# Patient Record
Sex: Male | Born: 1965
Health system: Southern US, Community
[De-identification: ages and names within clinical notes are randomized; demographics above are authoritative.]

## PROBLEM LIST (undated history)

## (undated) DIAGNOSIS — E7800 Pure hypercholesterolemia, unspecified: Secondary | ICD-10-CM

## (undated) DIAGNOSIS — I10 Essential (primary) hypertension: Secondary | ICD-10-CM

## (undated) DIAGNOSIS — B2790 Infectious mononucleosis, unspecified without complication: Secondary | ICD-10-CM

## (undated) HISTORY — PX: SHOULDER ARTHROSCOPY: SHX128

## (undated) HISTORY — PX: HERNIA REPAIR: SHX51

---

## 1999-04-28 ENCOUNTER — Emergency Department (HOSPITAL_COMMUNITY): Admission: EM | Admit: 1999-04-28 | Discharge: 1999-04-28 | Payer: Self-pay | Admitting: Emergency Medicine

## 2000-06-12 ENCOUNTER — Encounter: Payer: Self-pay | Admitting: General Surgery

## 2000-06-12 ENCOUNTER — Encounter: Admission: RE | Admit: 2000-06-12 | Discharge: 2000-06-12 | Payer: Self-pay | Admitting: General Surgery

## 2000-06-13 ENCOUNTER — Ambulatory Visit (HOSPITAL_BASED_OUTPATIENT_CLINIC_OR_DEPARTMENT_OTHER): Admission: RE | Admit: 2000-06-13 | Discharge: 2000-06-13 | Payer: Self-pay | Admitting: General Surgery

## 2001-06-28 ENCOUNTER — Emergency Department (HOSPITAL_COMMUNITY): Admission: EM | Admit: 2001-06-28 | Discharge: 2001-06-29 | Payer: Self-pay | Admitting: Podiatry

## 2003-01-07 ENCOUNTER — Emergency Department (HOSPITAL_COMMUNITY): Admission: EM | Admit: 2003-01-07 | Discharge: 2003-01-07 | Payer: Self-pay

## 2004-06-25 ENCOUNTER — Emergency Department (HOSPITAL_COMMUNITY): Admission: EM | Admit: 2004-06-25 | Discharge: 2004-06-25 | Payer: Self-pay | Admitting: Emergency Medicine

## 2005-07-18 ENCOUNTER — Emergency Department (HOSPITAL_COMMUNITY): Admission: EM | Admit: 2005-07-18 | Discharge: 2005-07-18 | Payer: Self-pay | Admitting: Family Medicine

## 2006-04-26 ENCOUNTER — Emergency Department (HOSPITAL_COMMUNITY): Admission: EM | Admit: 2006-04-26 | Discharge: 2006-04-26 | Payer: Self-pay | Admitting: Emergency Medicine

## 2011-09-16 ENCOUNTER — Ambulatory Visit (INDEPENDENT_AMBULATORY_CARE_PROVIDER_SITE_OTHER): Payer: 59

## 2011-09-16 DIAGNOSIS — J9801 Acute bronchospasm: Secondary | ICD-10-CM

## 2011-09-16 DIAGNOSIS — J111 Influenza due to unidentified influenza virus with other respiratory manifestations: Secondary | ICD-10-CM

## 2011-09-16 DIAGNOSIS — R05 Cough: Secondary | ICD-10-CM

## 2014-11-21 ENCOUNTER — Emergency Department (HOSPITAL_COMMUNITY): Payer: 59

## 2014-11-21 ENCOUNTER — Emergency Department (HOSPITAL_COMMUNITY)
Admission: EM | Admit: 2014-11-21 | Discharge: 2014-11-21 | Disposition: A | Discharge status: Home / Self Care | Payer: 59 | Attending: Emergency Medicine | Admitting: Emergency Medicine

## 2014-11-21 ENCOUNTER — Encounter (HOSPITAL_COMMUNITY): Payer: Self-pay | Admitting: Cardiology

## 2014-11-21 DIAGNOSIS — Z8619 Personal history of other infectious and parasitic diseases: Secondary | ICD-10-CM | POA: Diagnosis not present

## 2014-11-21 DIAGNOSIS — R1011 Right upper quadrant pain: Secondary | ICD-10-CM | POA: Diagnosis not present

## 2014-11-21 DIAGNOSIS — Z79899 Other long term (current) drug therapy: Secondary | ICD-10-CM | POA: Diagnosis not present

## 2014-11-21 DIAGNOSIS — Z720 Tobacco use: Secondary | ICD-10-CM | POA: Diagnosis not present

## 2014-11-21 DIAGNOSIS — R1013 Epigastric pain: Secondary | ICD-10-CM | POA: Diagnosis not present

## 2014-11-21 DIAGNOSIS — R109 Unspecified abdominal pain: Secondary | ICD-10-CM | POA: Diagnosis present

## 2014-11-21 DIAGNOSIS — Z8639 Personal history of other endocrine, nutritional and metabolic disease: Secondary | ICD-10-CM | POA: Diagnosis not present

## 2014-11-21 HISTORY — DX: Infectious mononucleosis, unspecified without complication: B27.90

## 2014-11-21 HISTORY — DX: Pure hypercholesterolemia, unspecified: E78.00

## 2014-11-21 LAB — COMPREHENSIVE METABOLIC PANEL
ALT: 25 U/L (ref 0–53)
AST: 22 U/L (ref 0–37)
Albumin: 4 g/dL (ref 3.5–5.2)
Alkaline Phosphatase: 66 U/L (ref 39–117)
Anion gap: 10 (ref 5–15)
BUN: 16 mg/dL (ref 6–23)
CO2: 23 mmol/L (ref 19–32)
Calcium: 9.6 mg/dL (ref 8.4–10.5)
Chloride: 106 mmol/L (ref 96–112)
Creatinine, Ser: 1.1 mg/dL (ref 0.50–1.35)
GFR calc Af Amer: >90 mL/min (ref >90)
GFR calc non Af Amer: 79 mL/min — ABNORMAL LOW (ref >90)
Glucose, Bld: 93 mg/dL (ref 70–99)
Potassium: 4.5 mmol/L (ref 3.5–5.1)
Sodium: 139 mmol/L (ref 135–145)
Total Bilirubin: 0.7 mg/dL (ref 0.3–1.2)
Total Protein: 7 g/dL (ref 6.0–8.3)

## 2014-11-21 LAB — CBC WITH DIFFERENTIAL/PLATELET
BASOS ABS: 0 10*3/uL (ref 0.0–0.1)
BASOS PCT: 0 % (ref 0–1)
EOS ABS: 0.1 10*3/uL (ref 0.0–0.7)
Eosinophils Relative: 1 % (ref 0–5)
HCT: 42.7 % (ref 39.0–52.0)
Hemoglobin: 14.3 g/dL (ref 13.0–17.0)
LYMPHS PCT: 24 % (ref 12–46)
Lymphs Abs: 1.7 10*3/uL (ref 0.7–4.0)
MCH: 30.9 pg (ref 26.0–34.0)
MCHC: 33.5 g/dL (ref 30.0–36.0)
MCV: 92.2 fL (ref 78.0–100.0)
MONO ABS: 0.4 10*3/uL (ref 0.1–1.0)
Monocytes Relative: 5 % (ref 3–12)
Neutro Abs: 4.9 10*3/uL (ref 1.7–7.7)
Neutrophils Relative %: 70 % (ref 43–77)
Platelets: 224 10*3/uL (ref 150–400)
RBC: 4.63 MIL/uL (ref 4.22–5.81)
RDW: 12.3 % (ref 11.5–15.5)
WBC: 7.1 10*3/uL (ref 4.0–10.5)

## 2014-11-21 LAB — LIPASE, BLOOD: Lipase: 27 U/L (ref 11–59)

## 2014-11-21 MED ORDER — IOHEXOL 300 MG/ML  SOLN
100.0000 mL | Freq: Once | INTRAMUSCULAR | Status: AC | PRN
  Administered 2014-11-21: 100 mL via INTRAVENOUS

## 2014-11-21 MED ORDER — HYDROMORPHONE HCL 1 MG/ML IJ SOLN
1.0000 mg | Freq: Once | INTRAMUSCULAR | Status: AC
  Administered 2014-11-21: 1 mg via INTRAVENOUS
  Filled 2014-11-21: qty 1

## 2014-11-21 MED ORDER — IOHEXOL 300 MG/ML  SOLN
25.0000 mL | Freq: Once | INTRAMUSCULAR | Status: AC | PRN
  Administered 2014-11-21: 25 mL via ORAL

## 2014-11-21 NOTE — Discharge Instructions (Signed)
Read the information below.  You may return to the Emergency Department at any time for worsening condition or any new symptoms that concern you.  If you develop high fevers, worsening abdominal pain, uncontrolled vomiting, or are unable to tolerate fluids by mouth, return to the ER for a recheck.     Abdominal Pain Many things can cause abdominal pain. Usually, abdominal pain is not caused by a disease and will improve without treatment. It can often be observed and treated at home. Your health care provider will do a physical exam and possibly order blood tests and X-rays to help determine the seriousness of your pain. However, in many cases, more time must pass before a clear cause of the pain can be found. Before that point, your health care provider may not know if you need more testing or further treatment. HOME CARE INSTRUCTIONS  Monitor your abdominal pain for any changes. The following actions may help to alleviate any discomfort you are experiencing:  Only take over-the-counter or prescription medicines as directed by your health care provider.  Do not take laxatives unless directed to do so by your health care provider.  Try a clear liquid diet (broth, tea, or water) as directed by your health care provider. Slowly move to a bland diet as tolerated. SEEK MEDICAL CARE IF:  You have unexplained abdominal pain.  You have abdominal pain associated with nausea or diarrhea.  You have pain when you urinate or have a bowel movement.  You experience abdominal pain that wakes you in the night.  You have abdominal pain that is worsened or improved by eating food.  You have abdominal pain that is worsened with eating fatty foods.  You have a fever. SEEK IMMEDIATE MEDICAL CARE IF:   Your pain does not go away within 2 hours.  You keep throwing up (vomiting).  Your pain is felt only in portions of the abdomen, such as the right side or the left lower portion of the abdomen.  You pass  bloody or black tarry stools. MAKE SURE YOU:  Understand these instructions.   Will watch your condition.   Will get help right away if you are not doing well or get worse.  Document Released: 07/04/2005 Document Revised: 09/29/2013 Document Reviewed: 06/03/2013 [facility] Patient Information 2015 [place], [place]. This information is not intended to replace advice given to you by your health care provider. Make sure you discuss any questions you have with your health care provider.  Pain of Unknown Etiology (Pain Without a Known Cause) You have come to your caregiver because of pain. Pain can occur in any part of the body. Often there is not a definite cause. If your laboratory (blood or urine) work was normal and X-rays or other studies were normal, your caregiver may treat you without knowing the cause of the pain. An example of this is the headache. Most headaches are diagnosed by taking a history. This means your caregiver asks you questions about your headaches. Your caregiver determines a treatment based on your answers. Usually testing done for headaches is normal. Often testing is not done unless there is no response to medications. Regardless of where your pain is located today, you can be given medications to make you comfortable. If no physical cause of pain can be found, most cases of pain will gradually leave as suddenly as they came.  If you have a painful condition and no reason can be found for the pain, it is important that you  follow up with your caregiver. If the pain becomes worse or does not go away, it may be necessary to repeat tests and look further for a possible cause.  Only take over-the-counter or prescription medicines for pain, discomfort, or fever as directed by your caregiver.  For the protection of your privacy, test results cannot be given over the phone. Make sure you receive the results of your test. Ask how these results are to be obtained if you have not been  informed. It is your responsibility to obtain your test results.  You may continue all activities unless the activities cause more pain. When the pain lessens, it is important to gradually resume normal activities. Resume activities by beginning slowly and gradually increasing the intensity and duration of the activities or exercise. During periods of severe pain, bed rest may be helpful. Lie or sit in any position that is comfortable.  Ice used for acute (sudden) conditions may be effective. Use a large plastic bag filled with ice and wrapped in a towel. This may provide pain relief.  See your caregiver for continued problems. Your caregiver can help or refer you for exercises or physical therapy if necessary. If you were given medications for your condition, do not drive, operate machinery or power tools, or sign legal documents for 24 hours. Do not drink alcohol, take sleeping pills, or take other medications that may interfere with treatment. See your caregiver immediately if you have pain that is becoming worse and not relieved by medications. Document Released: 06/19/2001 Document Revised: 07/15/2013 Document Reviewed: 09/24/2005 [facility] Patient Information 2015 [place], [place]. This information is not intended to replace advice given to you by your health care provider. Make sure you discuss any questions you have with your health care provider.

## 2014-11-21 NOTE — ED Notes (Signed)
Pt reports he developed epigastric pain about 0915. Denies any n/v with the pain. Pain comes and goes, rates a 8/10.

## 2014-11-21 NOTE — ED Provider Notes (Signed)
CSN: [id number]     Arrival date & time 11/21/14  1008 History   First MD Initiated Contact with Patient 11/21/14 1027     Chief Complaint  Patient presents with  . Abdominal Pain     (Consider location/radiation/quality/duration/timing/severity/associated sxs/prior Treatment) The history is provided by the patient.     Patient presents with sudden onset epigastric pain.  Pain is described as a "knot" and "visceral," came on suddenly, is 8/10 intensity, somewhat better with sitting upright, still, and controlling his breathing.   No radiation. Has had normal bowel movements without hematochezia or melena.  Denies fevers, nausea, urinary symptoms, bowel changes.  No abnormal foods.  No significant NSAID or alcohol usage.  Pt does smoke cigarettes.  No known PUD, pancreatitis.  Does have occasional indigestion/reflux symptoms.  Past Medical History  Diagnosis Date  . Mononucleosis   . Hypercholesteremia    Past Surgical History  Procedure Laterality Date  . Shoulder arthroscopy    . Hernia repair     History reviewed. No pertinent family history. History  Substance Use Topics  . Smoking status: Current Some Day Smoker  . Smokeless tobacco: Not on file  . Alcohol Use: Yes    Review of Systems  All other systems reviewed and are negative.     Allergies  Review of patient's allergies indicates no known allergies.  Home Medications   Prior to Admission medications   Medication Sig Start Date End Date Taking? Authorizing Provider  ibuprofen (ADVIL,MOTRIN) 800 MG tablet Take 800 mg by mouth every 8 (eight) hours as needed for moderate pain.   Yes Historical Provider, MD  varenicline (CHANTIX) 1 MG tablet Take 1 mg by mouth 2 (two) times daily.   Yes Historical Provider, MD   BP 147/97 mmHg  Temp(Src) 98.3 F (36.8 C) (Oral)  Resp 18  SpO2 99% Physical Exam  Constitutional: He appears well-developed and well-nourished. No distress.  HENT:  Head: Normocephalic and  atraumatic.  Neck: Neck supple.  Cardiovascular: Normal rate and regular rhythm.   Pulmonary/Chest: Effort normal and breath sounds normal. No respiratory distress. He has no wheezes. He has no rales.  Abdominal: Soft. He exhibits no distension and no mass. There is tenderness in the right upper quadrant and epigastric area. There is guarding. There is no rebound.  Neurological: He is alert. He exhibits normal muscle tone.  Skin: He is not diaphoretic.  Nursing note and vitals reviewed.   ED Course  Procedures (including critical care time) Labs Review Labs Reviewed  COMPREHENSIVE METABOLIC PANEL - Abnormal; Notable for the following:    GFR calc non Af Amer 79 (*)    All other components within normal limits  LIPASE, BLOOD  CBC WITH DIFFERENTIAL/PLATELET    Imaging Review Ct Abdomen Pelvis W Contrast  11/21/2014   CLINICAL DATA:  Acute onset epigastric abdominal pain several hrs ago.  EXAM: CT ABDOMEN AND PELVIS WITH CONTRAST  TECHNIQUE: Multidetector CT imaging of the abdomen and pelvis was performed using the standard protocol following bolus administration of intravenous contrast.  CONTRAST:  111mL OMNIPAQUE IOHEXOL 300 MG/ML  SOLN  COMPARISON:  None.  FINDINGS: Lower Chest:  Unremarkable.  Hepatobiliary: No masses or other significant abnormality identified. Gallbladder is unremarkable.  Pancreas: No mass, inflammatory changes, or other significant abnormality identified.  Spleen:  Within normal limits in size and appearance.  Adrenals:  No masses identified.  Kidneys/Urinary Tract:  No evidence of masses or hydronephrosis.  Stomach/Bowel/Peritoneum: No evidence of wall thickening,  mass, or obstruction. Normal appendix visualized.  Vascular/Lymphatic: No pathologically enlarged lymph nodes identified. No other significant abnormality visualized.  Reproductive:  No mass or other significant abnormality identified.  Other:  None.  Musculoskeletal:  No suspicious bone lesions identified.   IMPRESSION: Negative. No acute findings or other significant abnormality identified within the abdomen or pelvis.   Electronically Signed   By: Earle Gell M.D.   On: 11/21/2014 13:24   Dg Abd Acute W/chest  11/21/2014   CLINICAL DATA:  Abdominal pain and nausea.  Initial encounter.  EXAM: ACUTE ABDOMEN SERIES (ABDOMEN 2 VIEW & CHEST 1 VIEW)  COMPARISON:  04/26/2006 radiographs  FINDINGS: The cardiomediastinal silhouette is unremarkable.  Lungs are clear.  There is no evidence of airspace disease, pleural effusion or pneumothorax.  Scattered nondistended gas-filled loops of small bowel are identified.  Gas and stool in the colon and rectum are noted.  No dilated bowel loops or pneumoperitoneum identified.  No suspicious calcifications identified.  No acute bony abnormalities are noted.  IMPRESSION: Nonspecific nonobstructive bowel gas pattern - may represent an enteritis.  No evidence of active cardiopulmonary disease.   Electronically Signed   By: Margarette Canada M.D.   On: 11/21/2014 12:42     EKG Interpretation None       Pt has remained pain free in ED since returning from xray.    MDM   Final diagnoses:  Epigastric pain   Afebrile, nontoxic patient with sudden onset epigastric pain without associated symptoms.  Pain resolved in ED.  Labs normal, acute abd xrays unremarkable, CT abd/pelvis without acute findings.   D/C home with PCP follow up.  Discussed result, findings, treatment, and follow up  with patient.  Pt given return precautions.  Pt verbalizes understanding and agrees with plan.        Clayton Bibles, PA-C 11/21/14 [place], MD 11/21/14 [phone]

## 2015-04-23 ENCOUNTER — Encounter (HOSPITAL_COMMUNITY): Payer: Self-pay | Admitting: Emergency Medicine

## 2015-04-23 ENCOUNTER — Emergency Department (HOSPITAL_COMMUNITY): Payer: 59

## 2015-04-23 ENCOUNTER — Emergency Department (HOSPITAL_COMMUNITY)
Admission: EM | Admit: 2015-04-23 | Discharge: 2015-04-23 | Disposition: A | Discharge status: Home / Self Care | Payer: 59 | Attending: Emergency Medicine | Admitting: Emergency Medicine

## 2015-04-23 DIAGNOSIS — R51 Headache: Secondary | ICD-10-CM | POA: Insufficient documentation

## 2015-04-23 DIAGNOSIS — Z79899 Other long term (current) drug therapy: Secondary | ICD-10-CM | POA: Diagnosis not present

## 2015-04-23 DIAGNOSIS — Z8639 Personal history of other endocrine, nutritional and metabolic disease: Secondary | ICD-10-CM | POA: Diagnosis not present

## 2015-04-23 DIAGNOSIS — Z720 Tobacco use: Secondary | ICD-10-CM | POA: Diagnosis not present

## 2015-04-23 DIAGNOSIS — Z8619 Personal history of other infectious and parasitic diseases: Secondary | ICD-10-CM | POA: Insufficient documentation

## 2015-04-23 DIAGNOSIS — R519 Headache, unspecified: Secondary | ICD-10-CM

## 2015-04-23 MED ORDER — SODIUM CHLORIDE 0.9 % IV BOLUS (SEPSIS)
1000.0000 mL | Freq: Once | INTRAVENOUS | Status: AC
  Administered 2015-04-23: 1000 mL via INTRAVENOUS

## 2015-04-23 MED ORDER — DIPHENHYDRAMINE HCL 50 MG/ML IJ SOLN
50.0000 mg | Freq: Once | INTRAMUSCULAR | Status: AC
  Administered 2015-04-23: 50 mg via INTRAVENOUS
  Filled 2015-04-23: qty 1

## 2015-04-23 MED ORDER — METOCLOPRAMIDE HCL 5 MG/ML IJ SOLN
10.0000 mg | Freq: Once | INTRAMUSCULAR | Status: AC
  Administered 2015-04-23: 10 mg via INTRAVENOUS
  Filled 2015-04-23: qty 2

## 2015-04-23 NOTE — Discharge Instructions (Signed)
General Headache Without Cause A headache is pain or discomfort felt around the head or neck area. The specific cause of a headache may not be found. There are many causes and types of headaches. A few common ones are:  Tension headaches.  Migraine headaches.  Cluster headaches.  Chronic daily headaches. HOME CARE INSTRUCTIONS   Keep all follow-up appointments with your caregiver or any specialist referral.  Only take over-the-counter or prescription medicines for pain or discomfort as directed by your caregiver.  Lie down in a dark, quiet room when you have a headache.  Keep a headache journal to find out what may trigger your migraine headaches. For example, write down:  What you eat and drink.  How much sleep you get.  Any change to your diet or medicines.  Try massage or other relaxation techniques.  Put ice packs or heat on the head and neck. Use these 3 to 4 times per day for 15 to 20 minutes each time, or as needed.  Limit stress.  Sit up straight, and do not tense your muscles.  Quit smoking if you smoke.  Limit alcohol use.  Decrease the amount of caffeine you drink, or stop drinking caffeine.  Eat and sleep on a regular schedule.  Get 7 to 9 hours of sleep, or as recommended by your caregiver.  Keep lights dim if bright lights bother you and make your headaches worse. SEEK MEDICAL CARE IF:   You have problems with the medicines you were prescribed.  Your medicines are not working.  You have a change from the usual headache.  You have nausea or vomiting. SEEK IMMEDIATE MEDICAL CARE IF:   Your headache becomes severe.  You have a fever.  You have a stiff neck.  You have loss of vision.  You have muscular weakness or loss of muscle control.  You start losing your balance or have trouble walking.  You feel faint or pass out.  You have severe symptoms that are different from your first symptoms. MAKE SURE YOU:   Understand these  instructions.  Will watch your condition.  Will get help right away if you are not doing well or get worse. Document Released: 09/24/2005 Document Revised: 12/17/2011 Document Reviewed: 10/10/2011 [facility] Patient Information 2015 [place], [place]. This information is not intended to replace advice given to you by your health care provider. Make sure you discuss any questions you have with your health care provider.    Possible Migraine Headache A migraine headache is an intense, throbbing pain on one or both sides of your head. A migraine can last for 30 minutes to several hours. CAUSES  The exact cause of a migraine headache is not always known. However, a migraine may be caused when nerves in the brain become irritated and release chemicals that cause inflammation. This causes pain. Certain things may also trigger migraines, such as:  Alcohol.  Smoking.  Stress.  Menstruation.  Aged cheeses.  Foods or drinks that contain nitrates, glutamate, aspartame, or tyramine.  Lack of sleep.  Chocolate.  Caffeine.  Hunger.  Physical exertion.  Fatigue.  Medicines used to treat chest pain (nitroglycerine), birth control pills, estrogen, and some blood pressure medicines. SIGNS AND SYMPTOMS  Pain on one or both sides of your head.  Pulsating or throbbing pain.  Severe pain that prevents daily activities.  Pain that is aggravated by any physical activity.  Nausea, vomiting, or both.  Dizziness.  Pain with exposure to bright lights, loud noises, or activity.  General sensitivity to bright lights, loud noises, or smells. Before you get a migraine, you may get warning signs that a migraine is coming (aura). An aura may include:  Seeing flashing lights.  Seeing bright spots, halos, or zigzag lines.  Having tunnel vision or blurred vision.  Having feelings of numbness or tingling.  Having trouble talking.  Having muscle weakness. DIAGNOSIS  A migraine headache  is often diagnosed based on:  Symptoms.  Physical exam.  A CT scan or MRI of your head. These imaging tests cannot diagnose migraines, but they can help rule out other causes of headaches. TREATMENT Medicines may be given for pain and nausea. Medicines can also be given to help prevent recurrent migraines.  HOME CARE INSTRUCTIONS  Only take over-the-counter or prescription medicines for pain or discomfort as directed by your health care provider. The use of long-term narcotics is not recommended.  Lie down in a dark, quiet room when you have a migraine.  Keep a journal to find out what may trigger your migraine headaches. For example, write down:  What you eat and drink.  How much sleep you get.  Any change to your diet or medicines.  Limit alcohol consumption.  Quit smoking if you smoke.  Get 7-9 hours of sleep, or as recommended by your health care provider.  Limit stress.  Keep lights dim if bright lights bother you and make your migraines worse. SEEK IMMEDIATE MEDICAL CARE IF:   Your migraine becomes severe.  You have a fever.  You have a stiff neck.  You have vision loss.  You have muscular weakness or loss of muscle control.  You start losing your balance or have trouble walking.  You feel faint or pass out.  You have severe symptoms that are different from your first symptoms. MAKE SURE YOU:   Understand these instructions.  Will watch your condition.  Will get help right away if you are not doing well or get worse. Document Released: 09/24/2005 Document Revised: 02/08/2014 Document Reviewed: 06/01/2013 [facility] Patient Information 2015 [place], [place]. This information is not intended to replace advice given to you by your health care provider. Make sure you discuss any questions you have with your health care provider.

## 2015-04-23 NOTE — ED Notes (Signed)
Patient at CT at this time. 

## 2015-04-23 NOTE — ED Notes (Signed)
Patient returned from [place]. Denies headache at this time. No distress noted. Spouse at bedside.

## 2015-04-23 NOTE — ED Notes (Signed)
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 Pain increases with movement, light, stress. Patient has taken 1500 mg Tylenol & 500 Aspirin/Migraine within the past hour. Pain 8/10 at worst. Now headache is 4/10 on 0-10 pain scale. Denies nausea at this time.

## 2015-04-23 NOTE — ED Provider Notes (Signed)
TIME SEEN: 5:35 AM  CHIEF COMPLAINT: Headache  HPI: Pt is a 49 y.o. male with history of hyperlipidemia who presents to the emergency department with headache that started at 4 PM yesterday. Reports that the headache started gradually behind both of his eyes at work and then radiated into his posterior head. Describes as dull, constant. Worse with lights and movement. Patient took Tylenol and Excedrin prior to arrival with some relief. No thunderclap or severe sudden onset headache. No numbness, tingling or focal weakness. Does report that 3 days ago he fell into a body of water backwards and struck his head hard. No loss of consciousness. Not on anticoagulation or any antiplatelets agent. Denies a history of chronic headaches. Denies fever but has had some lower cervical spine pain.  ROS: See HPI Constitutional: no fever  Eyes: no drainage  ENT: no runny nose   Cardiovascular:  no chest pain  Resp: no SOB  GI: no vomiting GU: no dysuria Integumentary: no rash  Allergy: no hives  Musculoskeletal: no leg swelling  Neurological: no slurred speech ROS otherwise negative  PAST MEDICAL HISTORY/PAST SURGICAL HISTORY:  Past Medical History  Diagnosis Date  . Mononucleosis   . Hypercholesteremia     MEDICATIONS:  Prior to Admission medications   Medication Sig Start Date End Date Taking? Authorizing Provider  ibuprofen (ADVIL,MOTRIN) 800 MG tablet Take 800 mg by mouth every 8 (eight) hours as needed for moderate pain.    Historical Provider, MD  varenicline (CHANTIX) 1 MG tablet Take 1 mg by mouth 2 (two) times daily.    Historical Provider, MD    ALLERGIES:  No Known Allergies  SOCIAL HISTORY:  History  Substance Use Topics  . Smoking status: Current Some Day Smoker -- 0.50 packs/day    Types: Cigarettes  . Smokeless tobacco: Current User    Types: Snuff  . Alcohol Use: 1.8 oz/week    3 Cans of beer per week    FAMILY HISTORY: History reviewed. No pertinent family  history.  EXAM: BP 137/96 mmHg  Pulse 65  Temp(Src) 97.5 F (36.4 C) (Oral)  Resp 16  SpO2 98% CONSTITUTIONAL: Alert and oriented and responds appropriately to questions. Well-appearing; well-nourished; GCS 15 HEAD: Normocephalic; atraumatic EYES: Conjunctivae clear, PERRL, EOMI ENT: normal nose; no rhinorrhea; moist mucous membranes; pharynx without lesions noted; no dental injury; no septal hematoma NECK: Supple, no meningismus, no LAD; patient has some lower cervical spine tenderness without step-off or deformity but has full range of motion in the neck that is painless CARD: RRR; S1 and S2 appreciated; no murmurs, no clicks, no rubs, no gallops RESP: Normal chest excursion without splinting or tachypnea; breath sounds clear and equal bilaterally; no wheezes, no rhonchi, no rales; no hypoxia or respiratory distress CHEST:  chest wall stable, no crepitus or ecchymosis or deformity, nontender to palpation ABD/GI: Normal bowel sounds; non-distended; soft, non-tender, no rebound, no guarding PELVIS:  stable, nontender to palpation BACK:  The back appears normal and is non-tender to palpation, there is no CVA tenderness; no midline spinal tenderness, step-off or deformity EXT: Normal ROM in all joints; non-tender to palpation; no edema; normal capillary refill; no cyanosis, no bony tenderness or bony deformity of patient's extremities, no joint effusion, no ecchymosis or lacerations    SKIN: Normal color for age and race; warm NEURO: Moves all extremities equally, sensation to light touch intact diffusely, cranial nerves II through XII intact, no dysmetria to finger to nose testing bilaterally, strength 5/5 in all  4 extremities PSYCH: The patient's mood and manner are appropriate. Grooming and personal hygiene are appropriate.  MEDICAL DECISION MAKING: Patient here with headache, neck pain. Suspect migraine as the cause of his headache, cervical strain from recent fall. No sudden onset, severe  headache. It was gradual in nature and has improved with over-the-counter medications. He is neurologically intact, afebrile, nontoxic. Head and cervical spine CT unremarkable. Pain completely resolved after Reglan and Benadryl. Will discharge home with outpatient neurology follow-up information. Discussed return precautions. He verbalized understanding and is comfortable with this plan.      [place], DO 04/23/15 [phone]

## 2017-01-18 DIAGNOSIS — C44311 Basal cell carcinoma of skin of nose: Secondary | ICD-10-CM | POA: Diagnosis not present

## 2017-01-18 DIAGNOSIS — D2371 Other benign neoplasm of skin of right lower limb, including hip: Secondary | ICD-10-CM | POA: Diagnosis not present

## 2017-01-18 DIAGNOSIS — L821 Other seborrheic keratosis: Secondary | ICD-10-CM | POA: Diagnosis not present

## 2017-02-06 DIAGNOSIS — C44311 Basal cell carcinoma of skin of nose: Secondary | ICD-10-CM | POA: Diagnosis not present

## 2017-03-11 DIAGNOSIS — D485 Neoplasm of uncertain behavior of skin: Secondary | ICD-10-CM | POA: Diagnosis not present

## 2017-03-11 DIAGNOSIS — D2371 Other benign neoplasm of skin of right lower limb, including hip: Secondary | ICD-10-CM | POA: Diagnosis not present

## 2017-09-13 ENCOUNTER — Other Ambulatory Visit: Payer: Self-pay | Admitting: Internal Medicine

## 2017-09-13 ENCOUNTER — Ambulatory Visit
Admission: RE | Admit: 2017-09-13 | Discharge: 2017-09-13 | Disposition: A | Discharge status: Home / Self Care | Payer: 59 | Source: Ambulatory Visit | Attending: Internal Medicine | Admitting: Internal Medicine

## 2017-09-13 DIAGNOSIS — R05 Cough: Secondary | ICD-10-CM | POA: Diagnosis not present

## 2017-09-13 DIAGNOSIS — Z0000 Encounter for general adult medical examination without abnormal findings: Secondary | ICD-10-CM | POA: Diagnosis not present

## 2017-09-13 DIAGNOSIS — E7800 Pure hypercholesterolemia, unspecified: Secondary | ICD-10-CM | POA: Diagnosis not present

## 2017-09-13 DIAGNOSIS — R059 Cough, unspecified: Secondary | ICD-10-CM

## 2017-09-13 DIAGNOSIS — R030 Elevated blood-pressure reading, without diagnosis of hypertension: Secondary | ICD-10-CM | POA: Diagnosis not present

## 2017-10-02 DIAGNOSIS — G4733 Obstructive sleep apnea (adult) (pediatric): Secondary | ICD-10-CM | POA: Diagnosis not present

## 2017-10-06 DIAGNOSIS — S335XXA Sprain of ligaments of lumbar spine, initial encounter: Secondary | ICD-10-CM | POA: Diagnosis not present

## 2017-10-11 DIAGNOSIS — M5431 Sciatica, right side: Secondary | ICD-10-CM | POA: Diagnosis not present

## 2017-10-11 DIAGNOSIS — G4733 Obstructive sleep apnea (adult) (pediatric): Secondary | ICD-10-CM | POA: Diagnosis not present

## 2017-10-17 DIAGNOSIS — M5441 Lumbago with sciatica, right side: Secondary | ICD-10-CM | POA: Diagnosis not present

## 2017-10-17 DIAGNOSIS — M5136 Other intervertebral disc degeneration, lumbar region: Secondary | ICD-10-CM | POA: Diagnosis not present

## 2017-10-17 DIAGNOSIS — M62830 Muscle spasm of back: Secondary | ICD-10-CM | POA: Diagnosis not present

## 2017-10-21 DIAGNOSIS — M5136 Other intervertebral disc degeneration, lumbar region: Secondary | ICD-10-CM | POA: Diagnosis not present

## 2017-10-21 DIAGNOSIS — M62830 Muscle spasm of back: Secondary | ICD-10-CM | POA: Diagnosis not present

## 2017-10-21 DIAGNOSIS — M5441 Lumbago with sciatica, right side: Secondary | ICD-10-CM | POA: Diagnosis not present

## 2017-10-25 DIAGNOSIS — M5441 Lumbago with sciatica, right side: Secondary | ICD-10-CM | POA: Diagnosis not present

## 2017-10-25 DIAGNOSIS — M5136 Other intervertebral disc degeneration, lumbar region: Secondary | ICD-10-CM | POA: Diagnosis not present

## 2017-10-25 DIAGNOSIS — M62830 Muscle spasm of back: Secondary | ICD-10-CM | POA: Diagnosis not present

## 2017-10-30 DIAGNOSIS — M5136 Other intervertebral disc degeneration, lumbar region: Secondary | ICD-10-CM | POA: Diagnosis not present

## 2017-10-30 DIAGNOSIS — M5441 Lumbago with sciatica, right side: Secondary | ICD-10-CM | POA: Diagnosis not present

## 2017-10-30 DIAGNOSIS — M62830 Muscle spasm of back: Secondary | ICD-10-CM | POA: Diagnosis not present

## 2017-10-31 DIAGNOSIS — M5441 Lumbago with sciatica, right side: Secondary | ICD-10-CM | POA: Diagnosis not present

## 2017-10-31 DIAGNOSIS — M5136 Other intervertebral disc degeneration, lumbar region: Secondary | ICD-10-CM | POA: Diagnosis not present

## 2017-10-31 DIAGNOSIS — M62830 Muscle spasm of back: Secondary | ICD-10-CM | POA: Diagnosis not present

## 2017-11-04 DIAGNOSIS — M62830 Muscle spasm of back: Secondary | ICD-10-CM | POA: Diagnosis not present

## 2017-11-04 DIAGNOSIS — M5441 Lumbago with sciatica, right side: Secondary | ICD-10-CM | POA: Diagnosis not present

## 2017-11-04 DIAGNOSIS — M5136 Other intervertebral disc degeneration, lumbar region: Secondary | ICD-10-CM | POA: Diagnosis not present

## 2017-11-05 DIAGNOSIS — M545 Low back pain: Secondary | ICD-10-CM | POA: Diagnosis not present

## 2017-11-05 DIAGNOSIS — M5441 Lumbago with sciatica, right side: Secondary | ICD-10-CM | POA: Diagnosis not present

## 2017-11-05 DIAGNOSIS — M5136 Other intervertebral disc degeneration, lumbar region: Secondary | ICD-10-CM | POA: Diagnosis not present

## 2017-11-05 DIAGNOSIS — M62830 Muscle spasm of back: Secondary | ICD-10-CM | POA: Diagnosis not present

## 2017-11-08 DIAGNOSIS — M5431 Sciatica, right side: Secondary | ICD-10-CM | POA: Diagnosis not present

## 2017-11-08 DIAGNOSIS — M545 Low back pain: Secondary | ICD-10-CM | POA: Diagnosis not present

## 2017-11-11 DIAGNOSIS — G4733 Obstructive sleep apnea (adult) (pediatric): Secondary | ICD-10-CM | POA: Diagnosis not present

## 2017-11-13 DIAGNOSIS — M62830 Muscle spasm of back: Secondary | ICD-10-CM | POA: Diagnosis not present

## 2017-11-13 DIAGNOSIS — J111 Influenza due to unidentified influenza virus with other respiratory manifestations: Secondary | ICD-10-CM | POA: Diagnosis not present

## 2017-11-13 DIAGNOSIS — M5136 Other intervertebral disc degeneration, lumbar region: Secondary | ICD-10-CM | POA: Diagnosis not present

## 2017-11-13 DIAGNOSIS — M5441 Lumbago with sciatica, right side: Secondary | ICD-10-CM | POA: Diagnosis not present

## 2017-11-18 DIAGNOSIS — M5136 Other intervertebral disc degeneration, lumbar region: Secondary | ICD-10-CM | POA: Diagnosis not present

## 2017-11-18 DIAGNOSIS — M62830 Muscle spasm of back: Secondary | ICD-10-CM | POA: Diagnosis not present

## 2017-11-18 DIAGNOSIS — M5441 Lumbago with sciatica, right side: Secondary | ICD-10-CM | POA: Diagnosis not present

## 2017-11-19 DIAGNOSIS — M62830 Muscle spasm of back: Secondary | ICD-10-CM | POA: Diagnosis not present

## 2017-11-19 DIAGNOSIS — M5441 Lumbago with sciatica, right side: Secondary | ICD-10-CM | POA: Diagnosis not present

## 2017-11-19 DIAGNOSIS — M5136 Other intervertebral disc degeneration, lumbar region: Secondary | ICD-10-CM | POA: Diagnosis not present

## 2017-11-22 DIAGNOSIS — M62830 Muscle spasm of back: Secondary | ICD-10-CM | POA: Diagnosis not present

## 2017-11-22 DIAGNOSIS — M5136 Other intervertebral disc degeneration, lumbar region: Secondary | ICD-10-CM | POA: Diagnosis not present

## 2017-11-22 DIAGNOSIS — M5441 Lumbago with sciatica, right side: Secondary | ICD-10-CM | POA: Diagnosis not present

## 2017-11-27 DIAGNOSIS — M5441 Lumbago with sciatica, right side: Secondary | ICD-10-CM | POA: Diagnosis not present

## 2017-11-27 DIAGNOSIS — M62830 Muscle spasm of back: Secondary | ICD-10-CM | POA: Diagnosis not present

## 2017-11-27 DIAGNOSIS — M5136 Other intervertebral disc degeneration, lumbar region: Secondary | ICD-10-CM | POA: Diagnosis not present

## 2017-12-09 DIAGNOSIS — G4733 Obstructive sleep apnea (adult) (pediatric): Secondary | ICD-10-CM | POA: Diagnosis not present

## 2017-12-11 DIAGNOSIS — M5441 Lumbago with sciatica, right side: Secondary | ICD-10-CM | POA: Diagnosis not present

## 2017-12-11 DIAGNOSIS — M5136 Other intervertebral disc degeneration, lumbar region: Secondary | ICD-10-CM | POA: Diagnosis not present

## 2017-12-11 DIAGNOSIS — M62830 Muscle spasm of back: Secondary | ICD-10-CM | POA: Diagnosis not present

## 2017-12-12 DIAGNOSIS — M5136 Other intervertebral disc degeneration, lumbar region: Secondary | ICD-10-CM | POA: Diagnosis not present

## 2017-12-12 DIAGNOSIS — M5441 Lumbago with sciatica, right side: Secondary | ICD-10-CM | POA: Diagnosis not present

## 2017-12-12 DIAGNOSIS — M62830 Muscle spasm of back: Secondary | ICD-10-CM | POA: Diagnosis not present

## 2017-12-17 DIAGNOSIS — M5136 Other intervertebral disc degeneration, lumbar region: Secondary | ICD-10-CM | POA: Diagnosis not present

## 2017-12-17 DIAGNOSIS — M62830 Muscle spasm of back: Secondary | ICD-10-CM | POA: Diagnosis not present

## 2017-12-17 DIAGNOSIS — M5441 Lumbago with sciatica, right side: Secondary | ICD-10-CM | POA: Diagnosis not present

## 2017-12-20 DIAGNOSIS — M62830 Muscle spasm of back: Secondary | ICD-10-CM | POA: Diagnosis not present

## 2017-12-20 DIAGNOSIS — M5136 Other intervertebral disc degeneration, lumbar region: Secondary | ICD-10-CM | POA: Diagnosis not present

## 2017-12-20 DIAGNOSIS — M5441 Lumbago with sciatica, right side: Secondary | ICD-10-CM | POA: Diagnosis not present

## 2017-12-30 DIAGNOSIS — M5136 Other intervertebral disc degeneration, lumbar region: Secondary | ICD-10-CM | POA: Diagnosis not present

## 2017-12-30 DIAGNOSIS — G4733 Obstructive sleep apnea (adult) (pediatric): Secondary | ICD-10-CM | POA: Diagnosis not present

## 2017-12-30 DIAGNOSIS — M5441 Lumbago with sciatica, right side: Secondary | ICD-10-CM | POA: Diagnosis not present

## 2017-12-30 DIAGNOSIS — E7800 Pure hypercholesterolemia, unspecified: Secondary | ICD-10-CM | POA: Diagnosis not present

## 2017-12-30 DIAGNOSIS — M62830 Muscle spasm of back: Secondary | ICD-10-CM | POA: Diagnosis not present

## 2018-01-01 DIAGNOSIS — G4733 Obstructive sleep apnea (adult) (pediatric): Secondary | ICD-10-CM | POA: Diagnosis not present

## 2018-01-09 DIAGNOSIS — G4733 Obstructive sleep apnea (adult) (pediatric): Secondary | ICD-10-CM | POA: Diagnosis not present

## 2018-01-10 DIAGNOSIS — G4733 Obstructive sleep apnea (adult) (pediatric): Secondary | ICD-10-CM | POA: Diagnosis not present

## 2018-02-08 DIAGNOSIS — G4733 Obstructive sleep apnea (adult) (pediatric): Secondary | ICD-10-CM | POA: Diagnosis not present

## 2018-03-11 DIAGNOSIS — G4733 Obstructive sleep apnea (adult) (pediatric): Secondary | ICD-10-CM | POA: Diagnosis not present

## 2018-04-10 DIAGNOSIS — G4733 Obstructive sleep apnea (adult) (pediatric): Secondary | ICD-10-CM | POA: Diagnosis not present

## 2018-05-11 DIAGNOSIS — G4733 Obstructive sleep apnea (adult) (pediatric): Secondary | ICD-10-CM | POA: Diagnosis not present

## 2018-05-15 DIAGNOSIS — K219 Gastro-esophageal reflux disease without esophagitis: Secondary | ICD-10-CM | POA: Diagnosis not present

## 2018-06-11 DIAGNOSIS — G4733 Obstructive sleep apnea (adult) (pediatric): Secondary | ICD-10-CM | POA: Diagnosis not present

## 2018-07-11 DIAGNOSIS — G4733 Obstructive sleep apnea (adult) (pediatric): Secondary | ICD-10-CM | POA: Diagnosis not present

## 2018-07-14 DIAGNOSIS — H524 Presbyopia: Secondary | ICD-10-CM | POA: Diagnosis not present

## 2018-07-14 DIAGNOSIS — H5213 Myopia, bilateral: Secondary | ICD-10-CM | POA: Diagnosis not present

## 2018-07-21 DIAGNOSIS — G4733 Obstructive sleep apnea (adult) (pediatric): Secondary | ICD-10-CM | POA: Diagnosis not present

## 2018-08-20 DIAGNOSIS — M5431 Sciatica, right side: Secondary | ICD-10-CM | POA: Diagnosis not present

## 2018-09-18 DIAGNOSIS — E7800 Pure hypercholesterolemia, unspecified: Secondary | ICD-10-CM | POA: Diagnosis not present

## 2018-09-18 DIAGNOSIS — Z125 Encounter for screening for malignant neoplasm of prostate: Secondary | ICD-10-CM | POA: Diagnosis not present

## 2018-09-18 DIAGNOSIS — Z0000 Encounter for general adult medical examination without abnormal findings: Secondary | ICD-10-CM | POA: Diagnosis not present

## 2018-09-18 DIAGNOSIS — G4733 Obstructive sleep apnea (adult) (pediatric): Secondary | ICD-10-CM | POA: Diagnosis not present

## 2018-09-18 DIAGNOSIS — K219 Gastro-esophageal reflux disease without esophagitis: Secondary | ICD-10-CM | POA: Diagnosis not present

## 2018-10-28 DIAGNOSIS — G4733 Obstructive sleep apnea (adult) (pediatric): Secondary | ICD-10-CM | POA: Diagnosis not present

## 2018-10-30 DIAGNOSIS — E7800 Pure hypercholesterolemia, unspecified: Secondary | ICD-10-CM | POA: Diagnosis not present

## 2018-10-30 DIAGNOSIS — Z5181 Encounter for therapeutic drug level monitoring: Secondary | ICD-10-CM | POA: Diagnosis not present

## 2018-11-04 DIAGNOSIS — K219 Gastro-esophageal reflux disease without esophagitis: Secondary | ICD-10-CM | POA: Diagnosis not present

## 2018-12-15 DIAGNOSIS — Z1211 Encounter for screening for malignant neoplasm of colon: Secondary | ICD-10-CM | POA: Diagnosis not present

## 2018-12-15 DIAGNOSIS — D122 Benign neoplasm of ascending colon: Secondary | ICD-10-CM | POA: Diagnosis not present

## 2018-12-15 DIAGNOSIS — D120 Benign neoplasm of cecum: Secondary | ICD-10-CM | POA: Diagnosis not present

## 2019-01-07 DIAGNOSIS — G4733 Obstructive sleep apnea (adult) (pediatric): Secondary | ICD-10-CM | POA: Diagnosis not present

## 2019-01-28 DIAGNOSIS — G4733 Obstructive sleep apnea (adult) (pediatric): Secondary | ICD-10-CM | POA: Diagnosis not present

## 2019-06-27 ENCOUNTER — Other Ambulatory Visit: Payer: Self-pay

## 2019-06-27 ENCOUNTER — Emergency Department (HOSPITAL_COMMUNITY)
Admission: EM | Admit: 2019-06-27 | Discharge: 2019-06-28 | Disposition: A | Discharge status: Home / Self Care | Payer: 59 | Attending: Emergency Medicine | Admitting: Emergency Medicine

## 2019-06-27 ENCOUNTER — Encounter (HOSPITAL_COMMUNITY): Payer: Self-pay

## 2019-06-27 ENCOUNTER — Emergency Department (HOSPITAL_COMMUNITY): Payer: 59

## 2019-06-27 DIAGNOSIS — Z79899 Other long term (current) drug therapy: Secondary | ICD-10-CM | POA: Diagnosis not present

## 2019-06-27 DIAGNOSIS — I6523 Occlusion and stenosis of bilateral carotid arteries: Secondary | ICD-10-CM | POA: Insufficient documentation

## 2019-06-27 DIAGNOSIS — Z7982 Long term (current) use of aspirin: Secondary | ICD-10-CM | POA: Diagnosis not present

## 2019-06-27 DIAGNOSIS — I10 Essential (primary) hypertension: Secondary | ICD-10-CM | POA: Diagnosis not present

## 2019-06-27 DIAGNOSIS — G44201 Tension-type headache, unspecified, intractable: Secondary | ICD-10-CM | POA: Diagnosis not present

## 2019-06-27 DIAGNOSIS — R531 Weakness: Secondary | ICD-10-CM | POA: Insufficient documentation

## 2019-06-27 DIAGNOSIS — R51 Headache: Secondary | ICD-10-CM | POA: Diagnosis present

## 2019-06-27 DIAGNOSIS — R42 Dizziness and giddiness: Secondary | ICD-10-CM | POA: Insufficient documentation

## 2019-06-27 DIAGNOSIS — F17210 Nicotine dependence, cigarettes, uncomplicated: Secondary | ICD-10-CM | POA: Insufficient documentation

## 2019-06-27 LAB — DIFFERENTIAL
Abs Immature Granulocytes: 0.03 10*3/uL (ref 0.00–0.07)
Basophils Absolute: 0.1 10*3/uL (ref 0.0–0.1)
Basophils Relative: 1 %
Eosinophils Absolute: 0.2 10*3/uL (ref 0.0–0.5)
Eosinophils Relative: 3 %
Immature Granulocytes: 0 %
Lymphocytes Relative: 32 %
Lymphs Abs: 2.3 10*3/uL (ref 0.7–4.0)
Monocytes Absolute: 0.7 10*3/uL (ref 0.1–1.0)
Monocytes Relative: 9 %
Neutro Abs: 4.1 10*3/uL (ref 1.7–7.7)
Neutrophils Relative %: 55 %

## 2019-06-27 LAB — COMPREHENSIVE METABOLIC PANEL
ALT: 35 U/L (ref 0–44)
AST: 25 U/L (ref 15–41)
Albumin: 4 g/dL (ref 3.5–5.0)
Alkaline Phosphatase: 72 U/L (ref 38–126)
Anion gap: 13 (ref 5–15)
BUN: 15 mg/dL (ref 6–20)
CO2: 18 mmol/L — ABNORMAL LOW (ref 22–32)
Calcium: 9.1 mg/dL (ref 8.9–10.3)
Chloride: 103 mmol/L (ref 98–111)
Creatinine, Ser: 1.28 mg/dL — ABNORMAL HIGH (ref 0.61–1.24)
GFR calc Af Amer: >60 mL/min (ref >60)
GFR calc non Af Amer: >60 mL/min (ref >60)
Glucose, Bld: 86 mg/dL (ref 70–99)
Potassium: 3.8 mmol/L (ref 3.5–5.1)
Sodium: 134 mmol/L — ABNORMAL LOW (ref 135–145)
Total Bilirubin: 0.9 mg/dL (ref 0.3–1.2)
Total Protein: 7 g/dL (ref 6.5–8.1)

## 2019-06-27 LAB — I-STAT CHEM 8, ED
BUN: 17 mg/dL (ref 6–20)
Calcium, Ion: 1.05 mmol/L — ABNORMAL LOW (ref 1.15–1.40)
Chloride: 105 mmol/L (ref 98–111)
Creatinine, Ser: 1.2 mg/dL (ref 0.61–1.24)
Glucose, Bld: 84 mg/dL (ref 70–99)
HCT: 42 % (ref 39.0–52.0)
Hemoglobin: 14.3 g/dL (ref 13.0–17.0)
Potassium: 3.9 mmol/L (ref 3.5–5.1)
Sodium: 139 mmol/L (ref 135–145)
TCO2: 23 mmol/L (ref 22–32)

## 2019-06-27 LAB — CBC
HCT: 43 % (ref 39.0–52.0)
Hemoglobin: 14.4 g/dL (ref 13.0–17.0)
MCH: 31.4 pg (ref 26.0–34.0)
MCHC: 33.5 g/dL (ref 30.0–36.0)
MCV: 93.7 fL (ref 80.0–100.0)
Platelets: 240 10*3/uL (ref 150–400)
RBC: 4.59 MIL/uL (ref 4.22–5.81)
RDW: 11.9 % (ref 11.5–15.5)
WBC: 7.3 10*3/uL (ref 4.0–10.5)
nRBC: 0 % (ref 0.0–0.2)

## 2019-06-27 LAB — APTT: aPTT: 31 seconds (ref 24–36)

## 2019-06-27 LAB — PROTIME-INR
INR: 1 (ref 0.8–1.2)
Prothrombin Time: 13 seconds (ref 11.4–15.2)

## 2019-06-27 MED ORDER — KETOROLAC TROMETHAMINE 15 MG/ML IJ SOLN
15.0000 mg | Freq: Once | INTRAMUSCULAR | Status: AC
  Administered 2019-06-27: 23:00:00 15 mg via INTRAVENOUS
  Filled 2019-06-27: qty 1

## 2019-06-27 MED ORDER — PROCHLORPERAZINE EDISYLATE 10 MG/2ML IJ SOLN
10.0000 mg | Freq: Once | INTRAMUSCULAR | Status: AC
  Administered 2019-06-27: 23:00:00 10 mg via INTRAVENOUS
  Filled 2019-06-27: qty 2

## 2019-06-27 MED ORDER — DIPHENHYDRAMINE HCL 50 MG/ML IJ SOLN
50.0000 mg | Freq: Once | INTRAMUSCULAR | Status: AC
  Administered 2019-06-27: 50 mg via INTRAVENOUS
  Filled 2019-06-27: qty 1

## 2019-06-27 MED ORDER — IOHEXOL 350 MG/ML SOLN
100.0000 mL | Freq: Once | INTRAVENOUS | Status: AC | PRN
  Administered 2019-06-27: 100 mL via INTRAVENOUS

## 2019-06-27 MED ORDER — SODIUM CHLORIDE 0.9 % IV BOLUS
500.0000 mL | Freq: Once | INTRAVENOUS | Status: AC
  Administered 2019-06-27: 23:00:00 500 mL via INTRAVENOUS

## 2019-06-27 MED ORDER — SODIUM CHLORIDE 0.9% FLUSH
3.0000 mL | Freq: Once | INTRAVENOUS | Status: AC
  Administered 2019-06-27: 3 mL via INTRAVENOUS

## 2019-06-27 NOTE — Consult Note (Signed)
Neurology Consultation Reason for Consult: Headache Referring Physician: Rosalyn Gess  CC: Left sided weakness  History is obtained from:patient  HPI: Dale [name] is a 53 y.o. male with a history of hypercholesterolemia who was in his normal state of health until this evening around 8:30pm. He was just home after work when he reports relatively sudden onset headache which is holocephalic in location and assoicated with dizziness and nausea.   The onset was rather quick with only a minute or 2 between onset and maximal intensity.  Since that time, it is eased off some, but continues to throb.  It is associated with photophobia.  He does not get frequent headaches, but 4 years ago he did have a severe headache which was treated as migraine but this was not nearly as sudden onset   LKW: 8:30 pm.  tpa given?: no, mild symptoms   ROS: A 14 point ROS was performed and is negative except as noted in the HPI.   Past Medical History:  Diagnosis Date  . Hypercholesteremia   . Mononucleosis      FHx: adopted   Social History:  reports that he has been smoking cigarettes. He has been smoking about 0.50 packs per day. His smokeless tobacco use includes snuff. He reports current alcohol use of about 3.0 standard drinks of alcohol per week. He reports that he does not use drugs.   Exam: Current vital signs: Wt 87.8 kg  Vital signs in last 24 hours: Weight:  [87.8 kg] 87.8 kg (09/19 2200)   Physical Exam  Constitutional: Appears well-developed and well-nourished.  Psych: Affect appropriate to situation Eyes: No scleral injection HENT: No OP obstrucion Head: Normocephalic.  Cardiovascular: Normal rate and regular rhythm.  Respiratory: Effort normal, non-labored breathing GI: Soft.  No distension. There is no tenderness.  Skin: WDI  Neuro: Mental Status: Patient is awake, alert, oriented to person, place, month, year, and situation. Patient is able to give a clear and coherent  history. No signs of aphasia or neglect Cranial Nerves: II: Visual Fields are full. Pupils are equal, round, and reactive to light.   III,IV, VI: EOMI without ptosis or diploplia.  V: Facial sensation is symmetric to temperature VII: Facial movement is symmetric.  VIII: hearing is intact to voice X: Uvula elevates symmetrically XI: Shoulder shrug is symmetric. XII: tongue is midline without atrophy or fasciculations.  Motor: Tone is normal. Bulk is normal. He is able to hold both arms against gravity without drift. Has mild drift in the left leg.  Sensory: Sensation is symmetric to light touch and temperature in the arms and legs. Cerebellar: No clear ataxia in the arms., he is slow in the left leg, but able to move it without ataxia.     I have reviewed labs in epic and the results pertinent to this consultation are:   I have reviewed the images obtained: CT head - negative, CTA no evidence of aneurysm   Impression: 53 year old male with severe holocephalic headache with photophobia.  Though he does not have frequent headaches, he does have a history of headaches which could be migraine.  With negative imaging only 1.5 hours after onset, and negative CT angiography, I do not think that subarachnoid is at all likely.  I would favor getting an MRI given his mild left-sided weakness, but if this is negative then I would not pursue any further testing.  Recommendations: 1) MRI brain 2) If negative for acute findings, treat as complicated migraine. 3) I  also would encourage antiplatelet therapy with aspirin given the old incidental cerebellar stroke seen on CT.  He will need outpatient follow-up for lipid control, etc. as well.   Roland Rack, MD [facility] [phone]  If 7pm- 7am, please page neurology on call as listed in [place].

## 2019-06-27 NOTE — ED Notes (Signed)
Pt transported to MRI 

## 2019-06-27 NOTE — ED Triage Notes (Signed)
Pt comes via [facility], LSN 830pm, pt was having a BM and had sudden onset of headache, nausea, dizziness, pale and diaphoretic, photophobic. Pt has decreased L hand grip and L arm drift.

## 2019-06-27 NOTE — ED Provider Notes (Signed)
Dale [name] Provider Note   CSN: [id number] Arrival date & time: 06/27/19  2149  An emergency [name] physician performed an initial assessment on this suspected stroke patient at 2150.  History   Chief Complaint Chief Complaint  Patient presents with   Code Stroke    HPI Dale [name] is a 53 y.o. male.     Patient presents as possible code stroke with sudden onset of anterior headache, nausea, dizziness and diaphoresis.  This happened approximate 1 hour prior to arrival.  Patient presented with EMS.  Patient had weakness in the left arm and hand.  No history of stroke.  Patient had a migraine once before that he went to the emergency room for.  This is different than his previous migraine.  Patient is not on any blood thinning medication.  Patient has history of hypercholesterol.       Past Medical History:  Diagnosis Date   Hypercholesteremia    Mononucleosis     There are no active problems to display for this patient.   Past Surgical History:  Procedure Laterality Date   HERNIA REPAIR     SHOULDER ARTHROSCOPY          Home Medications    Prior to Admission medications   Medication Sig Start Date End Date Taking? Authorizing Provider  acetaminophen (TYLENOL) 325 MG tablet Take 650 mg by mouth every 6 (six) hours as needed for headache.    [provider]  aspirin EC 325 MG tablet Take 325 mg by mouth every 6 (six) hours as needed for mild pain.    [provider]  CAFFEINE PO Take 1 tablet by mouth as needed (for headache).    [provider]    Family History No family history on file.  Social History Social History   Tobacco Use   Smoking status: Current Some Day Smoker    Packs/day: 0.50    Types: Cigarettes   Smokeless tobacco: Current User    Types: Snuff  Substance Use Topics   Alcohol use: Yes    Alcohol/week: 3.0 standard drinks    Types: 3 Cans of beer per  week   Drug use: No     Allergies   Patient has no known allergies.   Review of Systems Review of Systems  Constitutional: Negative for chills and fever.  HENT: Negative for congestion.   Eyes: Negative for visual disturbance.  Respiratory: Negative for shortness of breath.   Cardiovascular: Negative for chest pain.  Gastrointestinal: Positive for nausea. Negative for abdominal pain and vomiting.  Genitourinary: Negative for dysuria and flank pain.  Musculoskeletal: Negative for back pain, neck pain and neck stiffness.  Skin: Negative for rash.  Neurological: Positive for dizziness, weakness, light-headedness and headaches.     Physical Exam Updated Vital Signs BP (!) 184/115    Pulse 82    Temp 97.7 F (36.5 C) (Oral)    Resp 18    Wt 87.8 kg    SpO2 100%   Physical Exam Vitals signs and nursing note reviewed.  Constitutional:      Appearance: He is well-developed.  HENT:     Head: Normocephalic and atraumatic.  Eyes:     General:        Right eye: No discharge.        Left eye: No discharge.     Conjunctiva/sclera: Conjunctivae normal.  Neck:     Musculoskeletal: Normal range of motion and neck supple.  Trachea: No tracheal deviation.  Cardiovascular:     Rate and Rhythm: Normal rate and regular rhythm.  Pulmonary:     Effort: Pulmonary effort is normal.     Breath sounds: Normal breath sounds.  Abdominal:     General: There is no distension.     Palpations: Abdomen is soft.     Tenderness: There is no abdominal tenderness. There is no guarding.  Skin:    General: Skin is warm.     Findings: No rash.  Neurological:     Mental Status: He is alert and oriented to person, place, and time.      ED Treatments / Results  Labs (all labs ordered are listed, but only abnormal results are displayed) Labs Reviewed  PROTIME-INR  APTT  CBC  DIFFERENTIAL  COMPREHENSIVE METABOLIC PANEL  I-STAT CHEM 8, ED  CBG MONITORING, ED     EKG None  Radiology Ct Angio Head W Or Wo Contrast  Result Date: 06/27/2019 CLINICAL DATA:  53 year old male with left arm drift and worst headache of life. EXAM: CT ANGIOGRAPHY HEAD AND NECK TECHNIQUE: Multidetector CT imaging of the head and neck was performed using the standard protocol during bolus administration of intravenous contrast. Multiplanar CT image reconstructions and MIPs were obtained to evaluate the vascular anatomy. Carotid stenosis measurements (when applicable) are obtained utilizing NASCET criteria, using the distal internal carotid diameter as the denominator. CONTRAST:  144mL OMNIPAQUE IOHEXOL 350 MG/ML SOLN COMPARISON:  Plain head CT 2202 hours today. FINDINGS: CTA NECK Skeleton: No acute osseous abnormality identified. Intermittent cervical spine disc and endplate degeneration. Upper chest: Negative. Other neck: Negative. Aortic arch: 3 vessel arch configuration with minimal arch atherosclerosis. Right carotid system: Minimal plaque at the right ICA origin spur. Mild calcified plaque at the right ICA bulb. No cervical right carotid stenosis. Left carotid system: Mild plaque at the left CCA origin without stenosis. Mild soft plaque in the ventral vessel proximal to the bifurcation without stenosis. Mild to moderate soft and calcified plaque at the left ICA origin and bulb without stenosis. Vertebral arteries: Negative proximal right subclavian artery. Plaque adjacent to the right vertebral artery origin with mild if any stenosis. The right vertebral is otherwise patent and negative to the skull base. Minor plaque in the proximal left subclavian artery without stenosis. Normal left vertebral artery origin. Tortuous left V1 segment. The left vertebral artery is mildly dominant and patent to the skull base without stenosis. CTA HEAD Posterior circulation: Mildly dominant distal left vertebral artery. No distal vertebral stenosis. Patent vertebrobasilar junction. Patent PICA origins.  Patent basilar artery. Normal SCA and PCA origins. Left posterior communicating artery is present, the right is diminutive or absent. Bilateral PCA branches are within normal limits. Anterior circulation: The left ICA siphon is patent without plaque or stenosis. Normal left ophthalmic and posterior communicating artery origins. Normal left ICA terminus. Left MCA M1 segment, bifurcation, and left MCA branches are patent. Mild venous contamination, left MCA branches appear within normal limits. The right ICA siphon is patent but diminutive, and the right ACA A1 segment is highly diminutive, the left A1 is dominant. No right siphon plaque or stenosis. Patent right ICA terminus. Right MCA M1 segment and bifurcation are patent without stenosis. Right MCA branches are within normal limits. Normal left A1 segment. Anterior communicating artery and bilateral ACA branches are within normal limits. Venous sinuses: Patent. Anatomic variants: Mildly dominant left vertebral artery, and dominant left ICA siphon along with dominant left and diminutive right  ACA A1 segments. Review of the MIP images confirms the above findings IMPRESSION: 1. Negative for large vessel occlusion. No intracranial aneurysm identified. 2. No definite intracranial arterial abnormality. ICA siphon asymmetry is felt related to anatomic variation with dominance of the left ACA A1 and left siphon. 3. Bilateral cervical carotid atherosclerosis without significant stenosis. Minor plaque at the right vertebral artery origin. Electronically Signed   By: Genevie Ann M.D.   On: 06/27/2019 22:26   Ct Angio Neck W Or Wo Contrast  Result Date: 06/27/2019 CLINICAL DATA:  53 year old male with left arm drift and worst headache of life. EXAM: CT ANGIOGRAPHY HEAD AND NECK TECHNIQUE: Multidetector CT imaging of the head and neck was performed using the standard protocol during bolus administration of intravenous contrast. Multiplanar CT image reconstructions and MIPs were  obtained to evaluate the vascular anatomy. Carotid stenosis measurements (when applicable) are obtained utilizing NASCET criteria, using the distal internal carotid diameter as the denominator. CONTRAST:  11mL OMNIPAQUE IOHEXOL 350 MG/ML SOLN COMPARISON:  Plain head CT 2202 hours today. FINDINGS: CTA NECK Skeleton: No acute osseous abnormality identified. Intermittent cervical spine disc and endplate degeneration. Upper chest: Negative. Other neck: Negative. Aortic arch: 3 vessel arch configuration with minimal arch atherosclerosis. Right carotid system: Minimal plaque at the right ICA origin spur. Mild calcified plaque at the right ICA bulb. No cervical right carotid stenosis. Left carotid system: Mild plaque at the left CCA origin without stenosis. Mild soft plaque in the ventral vessel proximal to the bifurcation without stenosis. Mild to moderate soft and calcified plaque at the left ICA origin and bulb without stenosis. Vertebral arteries: Negative proximal right subclavian artery. Plaque adjacent to the right vertebral artery origin with mild if any stenosis. The right vertebral is otherwise patent and negative to the skull base. Minor plaque in the proximal left subclavian artery without stenosis. Normal left vertebral artery origin. Tortuous left V1 segment. The left vertebral artery is mildly dominant and patent to the skull base without stenosis. CTA HEAD Posterior circulation: Mildly dominant distal left vertebral artery. No distal vertebral stenosis. Patent vertebrobasilar junction. Patent PICA origins. Patent basilar artery. Normal SCA and PCA origins. Left posterior communicating artery is present, the right is diminutive or absent. Bilateral PCA branches are within normal limits. Anterior circulation: The left ICA siphon is patent without plaque or stenosis. Normal left ophthalmic and posterior communicating artery origins. Normal left ICA terminus. Left MCA M1 segment, bifurcation, and left MCA  branches are patent. Mild venous contamination, left MCA branches appear within normal limits. The right ICA siphon is patent but diminutive, and the right ACA A1 segment is highly diminutive, the left A1 is dominant. No right siphon plaque or stenosis. Patent right ICA terminus. Right MCA M1 segment and bifurcation are patent without stenosis. Right MCA branches are within normal limits. Normal left A1 segment. Anterior communicating artery and bilateral ACA branches are within normal limits. Venous sinuses: Patent. Anatomic variants: Mildly dominant left vertebral artery, and dominant left ICA siphon along with dominant left and diminutive right ACA A1 segments. Review of the MIP images confirms the above findings IMPRESSION: 1. Negative for large vessel occlusion. No intracranial aneurysm identified. 2. No definite intracranial arterial abnormality. ICA siphon asymmetry is felt related to anatomic variation with dominance of the left ACA A1 and left siphon. 3. Bilateral cervical carotid atherosclerosis without significant stenosis. Minor plaque at the right vertebral artery origin. Electronically Signed   By: Genevie Ann M.D.   On: 06/27/2019  22:26   Ct Head Code Stroke Wo Contrast  Result Date: 06/27/2019 CLINICAL DATA:  Code stroke. 53 year old male with severe acute headache. Worst headache of life. Left arm drift. EXAM: CT HEAD WITHOUT CONTRAST TECHNIQUE: Contiguous axial images were obtained from the base of the skull through the vertex without intravenous contrast. COMPARISON:  Head and cervical spine CT 04/23/2015. FINDINGS: Brain: Normal cerebral volume. No midline shift, ventriculomegaly, mass effect, evidence of mass lesion, intracranial hemorrhage or evidence of cortically based acute infarction. Small chronic appearing infarct in the posteroinferior right cerebellum on series 3, image 7, may be new since 2016. Elsewhere gray-white matter differentiation is within normal limits throughout the brain.  Vascular: No suspicious intracranial vascular hyperdensity. Skull: No acute osseous abnormality identified. Sinuses/Orbits: Visualized paranasal sinuses and mastoids are stable and well pneumatized. Other: No acute orbit or scalp soft tissue findings. ASPECTS [facility] Stroke Program Early CT Score Total score (0-10 with 10 being normal): 10 IMPRESSION: 1. No acute intracranial abnormality. 2. Small chronic appearing right cerebellar infarct which may be new since 2016. 3.  ASPECTS 10. 4. These results were communicated to Dr. Leonel Ramsay at 10:05 pm on 06/27/2019 by text page via the [facility] messaging system. Electronically Signed   By: Genevie Ann M.D.   On: 06/27/2019 22:05    Procedures Procedures (including critical care time)  Medications Ordered in ED Medications  diphenhydrAMINE (BENADRYL) injection 50 mg (has no administration in time range)  prochlorperazine (COMPAZINE) injection 10 mg (has no administration in time range)  ketorolac (TORADOL) 15 MG/ML injection 15 mg (has no administration in time range)  sodium chloride 0.9 % bolus 500 mL (has no administration in time range)  sodium chloride flush (NS) 0.9 % injection 3 mL (3 mLs Intravenous Given 06/27/19 2218)  iohexol (OMNIPAQUE) 350 MG/ML injection 100 mL (100 mLs Intravenous Contrast Given 06/27/19 2211)     Initial Impression / Assessment and Plan / ED Course  I have reviewed the triage vital signs and the nursing notes.  Pertinent labs & imaging results that were available during my care of the patient were reviewed by me and considered in my medical decision making (see chart for details).       Patient presents with sudden onset headache and left arm weakness.  Concern for subarachnoid hemorrhage versus hypertensive hemorrhage versus stroke versus other.  Code stroke called as this was within 1 hour and patient had left arm drift and mild left arm weakness on exam.  Patient received CT scan directly after arrival and discussed  with neurology in the emergency room and concern for complex migraine versus less likely occult stroke since CT scan showed no bleeding. Plan for migraine cocktail, MRI, further observation in the emergency room.  Patient's care will be signed out to further monitor, reassess and follow-up MRI results.  Final Clinical Impressions(s) / ED Diagnoses   Final diagnoses:  Essential hypertension  Acute intractable tension-type headache    ED Discharge Orders    None       Elnora Morrison, MD 06/30/19 [phone]

## 2019-06-28 NOTE — Discharge Instructions (Signed)
You were seen today for a headache.  Your MRI and other imaging is reassuring.  You will be given contact information for neurology.  If headaches persist, you need to follow-up.

## 2019-06-28 NOTE — ED Provider Notes (Signed)
Patient signed out pending MRI.  MRI is negative for acute findings.  Patient was evaluated by neurology.  He was treated for complicated migraine.  Will provide with outpatient neurology follow-up.  Improved after migraine cocktail.  After history, exam, and medical workup I feel the patient has been appropriately medically screened and is safe for discharge home. Pertinent diagnoses were discussed with the patient. Patient was given return precautions.    Physical Exam  BP (!) 149/104   Pulse 76   Temp 97.7 F (36.5 C) (Oral)   Resp 13   Ht 1.727 m (5\' 8" )   Wt 87.8 kg   SpO2 96%   BMI 29.43 kg/m   Results for orders placed or performed during the hospital encounter of 06/27/19  Protime-INR  Result Value Ref Range   Prothrombin Time 13.0 11.4 - 15.2 seconds   INR 1.0 0.8 - 1.2  APTT  Result Value Ref Range   aPTT 31 24 - 36 seconds  CBC  Result Value Ref Range   WBC 7.3 4.0 - 10.5 K/uL   RBC 4.59 4.22 - 5.81 MIL/uL   Hemoglobin 14.4 13.0 - 17.0 g/dL   HCT 43.0 39.0 - 52.0 %   MCV 93.7 80.0 - 100.0 fL   MCH 31.4 26.0 - 34.0 pg   MCHC 33.5 30.0 - 36.0 g/dL   RDW 11.9 11.5 - 15.5 %   Platelets 240 150 - 400 K/uL   nRBC 0.0 0.0 - 0.2 %  Differential  Result Value Ref Range   Neutrophils Relative % 55 %   Neutro Abs 4.1 1.7 - 7.7 K/uL   Lymphocytes Relative 32 %   Lymphs Abs 2.3 0.7 - 4.0 K/uL   Monocytes Relative 9 %   Monocytes Absolute 0.7 0.1 - 1.0 K/uL   Eosinophils Relative 3 %   Eosinophils Absolute 0.2 0.0 - 0.5 K/uL   Basophils Relative 1 %   Basophils Absolute 0.1 0.0 - 0.1 K/uL   Immature Granulocytes 0 %   Abs Immature Granulocytes 0.03 0.00 - 0.07 K/uL  Comprehensive metabolic panel  Result Value Ref Range   Sodium 134 (L) 135 - 145 mmol/L   Potassium 3.8 3.5 - 5.1 mmol/L   Chloride 103 98 - 111 mmol/L   CO2 18 (L) 22 - 32 mmol/L   Glucose, Bld 86 70 - 99 mg/dL   BUN 15 6 - 20 mg/dL   Creatinine, Ser 1.28 (H) 0.61 - 1.24 mg/dL   Calcium 9.1 8.9 -  10.3 mg/dL   Total Protein 7.0 6.5 - 8.1 g/dL   Albumin 4.0 3.5 - 5.0 g/dL   AST 25 15 - 41 U/L   ALT 35 0 - 44 U/L   Alkaline Phosphatase 72 38 - 126 U/L   Total Bilirubin 0.9 0.3 - 1.2 mg/dL   GFR calc non Af Amer >60 >60 mL/min   GFR calc Af Amer >60 >60 mL/min   Anion gap 13 5 - 15  I-stat chem 8, ED  Result Value Ref Range   Sodium 139 135 - 145 mmol/L   Potassium 3.9 3.5 - 5.1 mmol/L   Chloride 105 98 - 111 mmol/L   BUN 17 6 - 20 mg/dL   Creatinine, Ser 1.20 0.61 - 1.24 mg/dL   Glucose, Bld 84 70 - 99 mg/dL   Calcium, Ion 1.05 (L) 1.15 - 1.40 mmol/L   TCO2 23 22 - 32 mmol/L   Hemoglobin 14.3 13.0 - 17.0 g/dL  HCT 42.0 39.0 - 52.0 %   Ct Angio Head W Or Wo Contrast  Result Date: 06/27/2019 CLINICAL DATA:  53 year old male with left arm drift and worst headache of life. EXAM: CT ANGIOGRAPHY HEAD AND NECK TECHNIQUE: Multidetector CT imaging of the head and neck was performed using the standard protocol during bolus administration of intravenous contrast. Multiplanar CT image reconstructions and MIPs were obtained to evaluate the vascular anatomy. Carotid stenosis measurements (when applicable) are obtained utilizing NASCET criteria, using the distal internal carotid diameter as the denominator. CONTRAST:  152mL OMNIPAQUE IOHEXOL 350 MG/ML SOLN COMPARISON:  Plain head CT 2202 hours today. FINDINGS: CTA NECK Skeleton: No acute osseous abnormality identified. Intermittent cervical spine disc and endplate degeneration. Upper chest: Negative. Other neck: Negative. Aortic arch: 3 vessel arch configuration with minimal arch atherosclerosis. Right carotid system: Minimal plaque at the right ICA origin spur. Mild calcified plaque at the right ICA bulb. No cervical right carotid stenosis. Left carotid system: Mild plaque at the left CCA origin without stenosis. Mild soft plaque in the ventral vessel proximal to the bifurcation without stenosis. Mild to moderate soft and calcified plaque at the  left ICA origin and bulb without stenosis. Vertebral arteries: Negative proximal right subclavian artery. Plaque adjacent to the right vertebral artery origin with mild if any stenosis. The right vertebral is otherwise patent and negative to the skull base. Minor plaque in the proximal left subclavian artery without stenosis. Normal left vertebral artery origin. Tortuous left V1 segment. The left vertebral artery is mildly dominant and patent to the skull base without stenosis. CTA HEAD Posterior circulation: Mildly dominant distal left vertebral artery. No distal vertebral stenosis. Patent vertebrobasilar junction. Patent PICA origins. Patent basilar artery. Normal SCA and PCA origins. Left posterior communicating artery is present, the right is diminutive or absent. Bilateral PCA branches are within normal limits. Anterior circulation: The left ICA siphon is patent without plaque or stenosis. Normal left ophthalmic and posterior communicating artery origins. Normal left ICA terminus. Left MCA M1 segment, bifurcation, and left MCA branches are patent. Mild venous contamination, left MCA branches appear within normal limits. The right ICA siphon is patent but diminutive, and the right ACA A1 segment is highly diminutive, the left A1 is dominant. No right siphon plaque or stenosis. Patent right ICA terminus. Right MCA M1 segment and bifurcation are patent without stenosis. Right MCA branches are within normal limits. Normal left A1 segment. Anterior communicating artery and bilateral ACA branches are within normal limits. Venous sinuses: Patent. Anatomic variants: Mildly dominant left vertebral artery, and dominant left ICA siphon along with dominant left and diminutive right ACA A1 segments. Review of the MIP images confirms the above findings IMPRESSION: 1. Negative for large vessel occlusion. No intracranial aneurysm identified. 2. No definite intracranial arterial abnormality. ICA siphon asymmetry is felt related  to anatomic variation with dominance of the left ACA A1 and left siphon. 3. Bilateral cervical carotid atherosclerosis without significant stenosis. Minor plaque at the right vertebral artery origin. Electronically Signed   By: Genevie Ann M.D.   On: 06/27/2019 22:26   Ct Angio Neck W Or Wo Contrast  Result Date: 06/27/2019 CLINICAL DATA:  53 year old male with left arm drift and worst headache of life. EXAM: CT ANGIOGRAPHY HEAD AND NECK TECHNIQUE: Multidetector CT imaging of the head and neck was performed using the standard protocol during bolus administration of intravenous contrast. Multiplanar CT image reconstructions and MIPs were obtained to evaluate the vascular anatomy. Carotid stenosis measurements (when  applicable) are obtained utilizing NASCET criteria, using the distal internal carotid diameter as the denominator. CONTRAST:  131mL OMNIPAQUE IOHEXOL 350 MG/ML SOLN COMPARISON:  Plain head CT 2202 hours today. FINDINGS: CTA NECK Skeleton: No acute osseous abnormality identified. Intermittent cervical spine disc and endplate degeneration. Upper chest: Negative. Other neck: Negative. Aortic arch: 3 vessel arch configuration with minimal arch atherosclerosis. Right carotid system: Minimal plaque at the right ICA origin spur. Mild calcified plaque at the right ICA bulb. No cervical right carotid stenosis. Left carotid system: Mild plaque at the left CCA origin without stenosis. Mild soft plaque in the ventral vessel proximal to the bifurcation without stenosis. Mild to moderate soft and calcified plaque at the left ICA origin and bulb without stenosis. Vertebral arteries: Negative proximal right subclavian artery. Plaque adjacent to the right vertebral artery origin with mild if any stenosis. The right vertebral is otherwise patent and negative to the skull base. Minor plaque in the proximal left subclavian artery without stenosis. Normal left vertebral artery origin. Tortuous left V1 segment. The left  vertebral artery is mildly dominant and patent to the skull base without stenosis. CTA HEAD Posterior circulation: Mildly dominant distal left vertebral artery. No distal vertebral stenosis. Patent vertebrobasilar junction. Patent PICA origins. Patent basilar artery. Normal SCA and PCA origins. Left posterior communicating artery is present, the right is diminutive or absent. Bilateral PCA branches are within normal limits. Anterior circulation: The left ICA siphon is patent without plaque or stenosis. Normal left ophthalmic and posterior communicating artery origins. Normal left ICA terminus. Left MCA M1 segment, bifurcation, and left MCA branches are patent. Mild venous contamination, left MCA branches appear within normal limits. The right ICA siphon is patent but diminutive, and the right ACA A1 segment is highly diminutive, the left A1 is dominant. No right siphon plaque or stenosis. Patent right ICA terminus. Right MCA M1 segment and bifurcation are patent without stenosis. Right MCA branches are within normal limits. Normal left A1 segment. Anterior communicating artery and bilateral ACA branches are within normal limits. Venous sinuses: Patent. Anatomic variants: Mildly dominant left vertebral artery, and dominant left ICA siphon along with dominant left and diminutive right ACA A1 segments. Review of the MIP images confirms the above findings IMPRESSION: 1. Negative for large vessel occlusion. No intracranial aneurysm identified. 2. No definite intracranial arterial abnormality. ICA siphon asymmetry is felt related to anatomic variation with dominance of the left ACA A1 and left siphon. 3. Bilateral cervical carotid atherosclerosis without significant stenosis. Minor plaque at the right vertebral artery origin. Electronically Signed   By: Genevie Ann M.D.   On: 06/27/2019 22:26   Mr Brain Wo Contrast  Result Date: 06/28/2019 CLINICAL DATA:  Headache, nausea, dizziness and photophobia. Left-sided weakness.  EXAM: MRI HEAD WITHOUT CONTRAST TECHNIQUE: Multiplanar, multiecho pulse sequences of the brain and surrounding structures were obtained without intravenous contrast. COMPARISON:  CT head 06/27/2019 FINDINGS: BRAIN: There is no acute infarct, acute hemorrhage or extra-axial collection. The white matter signal is normal for the patient's age. The cerebral and cerebellar volume are age-appropriate. There is no hydrocephalus. The midline structures are normal. VASCULAR: The major intracranial arterial and venous sinus flow voids are normal. Susceptibility-sensitive sequences show no chronic microhemorrhage or superficial siderosis. SKULL AND UPPER CERVICAL SPINE: Calvarial bone marrow signal is normal. There is no skull base mass. The visualized upper cervical spine and soft tissues are normal. SINUSES/ORBITS: There are no fluid levels or advanced mucosal thickening. The mastoid air cells and middle  ear cavities are free of fluid. The orbits are normal. IMPRESSION: Normal MRI of the brain. Electronically Signed   By: Ulyses Jarred M.D.   On: 06/28/2019 00:11   Ct Head Code Stroke Wo Contrast  Result Date: 06/27/2019 CLINICAL DATA:  Code stroke. 53 year old male with severe acute headache. Worst headache of life. Left arm drift. EXAM: CT HEAD WITHOUT CONTRAST TECHNIQUE: Contiguous axial images were obtained from the base of the skull through the vertex without intravenous contrast. COMPARISON:  Head and cervical spine CT 04/23/2015. FINDINGS: Brain: Normal cerebral volume. No midline shift, ventriculomegaly, mass effect, evidence of mass lesion, intracranial hemorrhage or evidence of cortically based acute infarction. Small chronic appearing infarct in the posteroinferior right cerebellum on series 3, image 7, may be new since 2016. Elsewhere gray-white matter differentiation is within normal limits throughout the brain. Vascular: No suspicious intracranial vascular hyperdensity. Skull: No acute osseous abnormality  identified. Sinuses/Orbits: Visualized paranasal sinuses and mastoids are stable and well pneumatized. Other: No acute orbit or scalp soft tissue findings. ASPECTS [facility] Stroke Program Early CT Score Total score (0-10 with 10 being normal): 10 IMPRESSION: 1. No acute intracranial abnormality. 2. Small chronic appearing right cerebellar infarct which may be new since 2016. 3.  ASPECTS 10. 4. These results were communicated to Dr. Leonel Ramsay at 10:05 pm on 06/27/2019 by text page via the [facility] messaging system. Electronically Signed   By: Genevie Ann M.D.   On: 06/27/2019 22:05        Merryl Hacker, MD 06/28/19 [id number]

## 2019-06-28 NOTE — ED Notes (Signed)
Dr. Horton at the bedside.  

## 2019-06-29 LAB — CBG MONITORING, ED: Glucose-Capillary: 90 mg/dL (ref 70–99)

## 2019-10-12 ENCOUNTER — Other Ambulatory Visit: Payer: 59

## 2019-11-09 IMAGING — MR MR HEAD W/O CM
10 of 11 series · 43 of 48 positions shown · non-contrast
Comparison: CT head 06/27/2019
COMPARISON: CT head 06/27/2019

Addendum:
CLINICAL DATA: Headache, nausea, dizziness and photophobia.
Left-sided weakness.

EXAM:
MRI HEAD WITHOUT CONTRAST
TECHNIQUE: Multiplanar, multiecho pulse sequences of the brain and surrounding
structures were obtained without intravenous contrast.

[Series 5: DWI · axial · 3.0mm · 0.88mm/px · z∈[-69,+71]mm · 10 of 96 slices shown (1 of 4)]
[im 1/96]
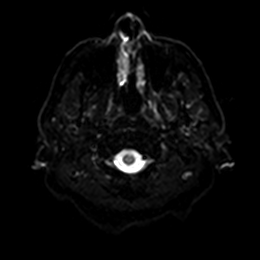
[im 11/96]
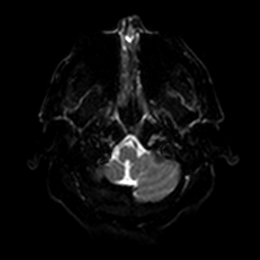
[im 22/96]
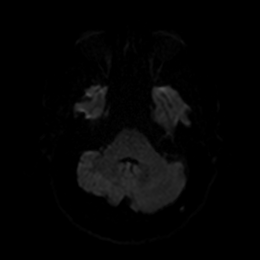
[im 32/96]
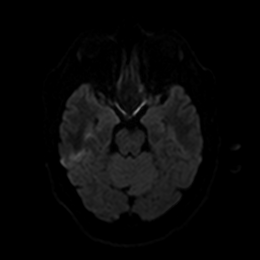
[im 43/96]
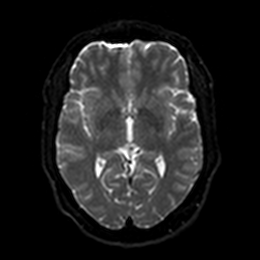
[im 53/96]
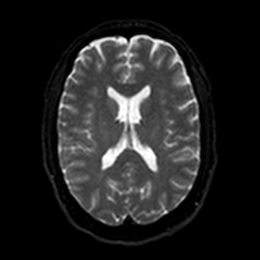
[im 64/96]
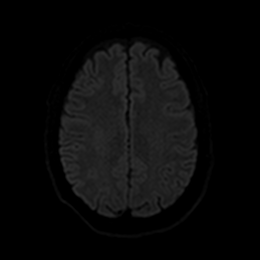
[im 74/96]
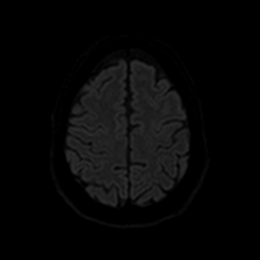
[im 85/96]
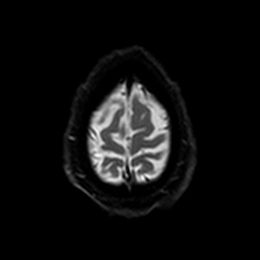
[im 96/96]
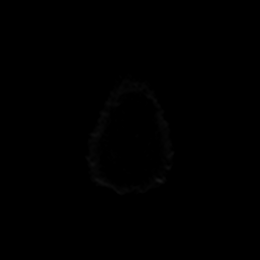

[Series 6: DWI · axial · 3.0mm · 0.88mm/px · z∈[-69,+71]mm · 5 of 46 slices shown (2 of 4)]
[im 1/46]
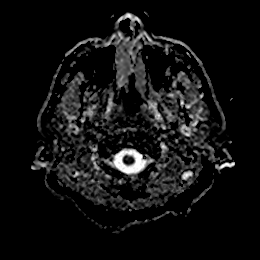
[im 12/46]
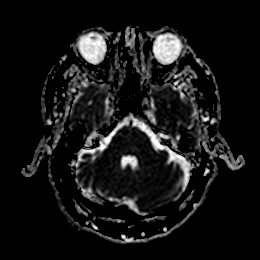
[im 23/46]
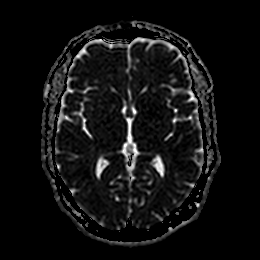
[im 34/46]
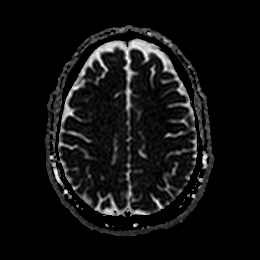
[im 46/46]
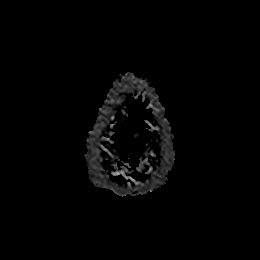

[Series 7: DWI · coronal · 4.0mm · 0.88mm/px · 6 of 70 slices shown (3 of 4)]
[im 1/70]
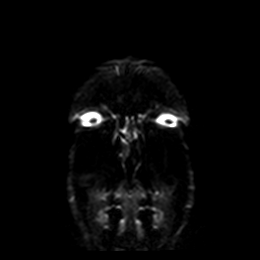
[im 14/70]
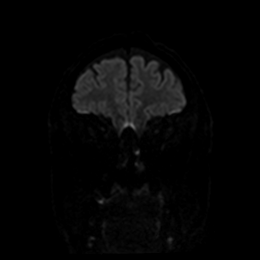
[im 28/70]
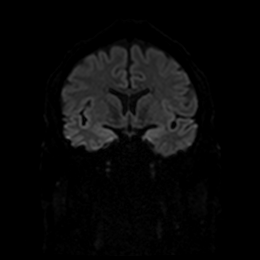
[im 42/70]
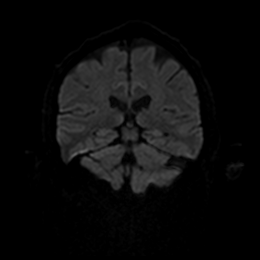
[im 56/70]
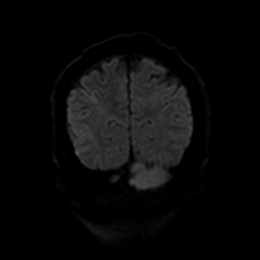
[im 70/70]
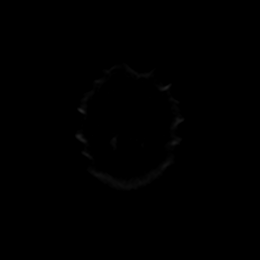

[Series 8: DWI · coronal · 4.0mm · 0.88mm/px · 3 of 35 slices shown (4 of 4)]
[im 1/35]
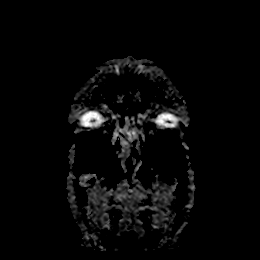
[im 18/35]
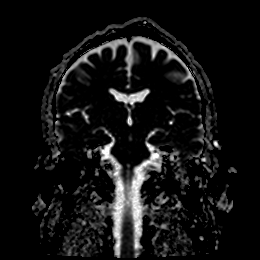
[im 35/35]
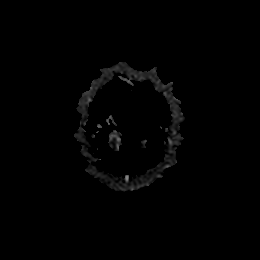

[Series 9: T1 · sagittal · 5.0mm · 0.75mm/px · 2 of 25 slices shown]
[im 1/25]
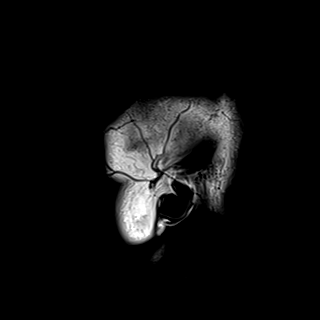
[im 25/25]
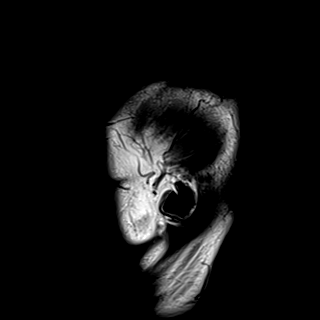

[Series 10: T2 · axial · 5.0mm · 0.72mm/px · z∈[-76,+68]mm · 2 of 25 slices shown (1 of 2)]
[im 1/25]
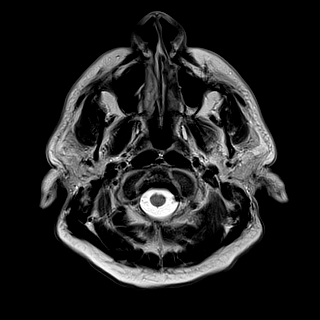
[im 25/25]
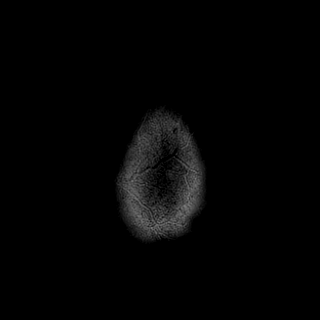

[Series 11: FLAIR · axial · 5.0mm · 0.45mm/px · z∈[-76,+68]mm · 2 of 25 slices shown]
[im 1/25]
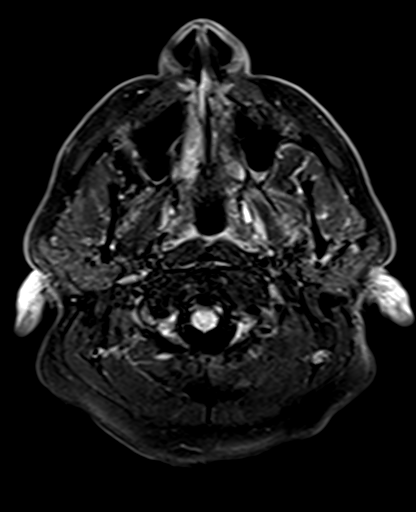
[im 25/25]
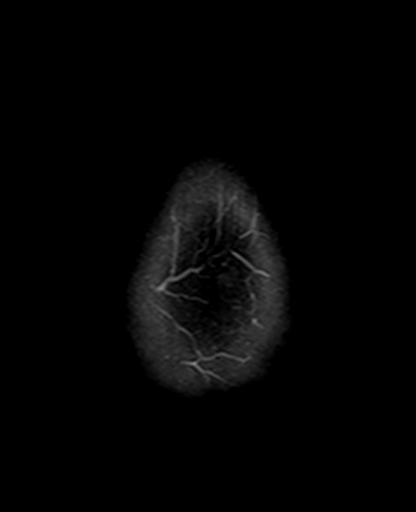

[Series 13: pha_images · axial · 3.0mm · 0.90mm/px · z∈[-88,+88]mm · 5 of 59 slices shown]
[im 1/59]
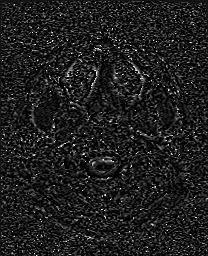
[im 15/59]
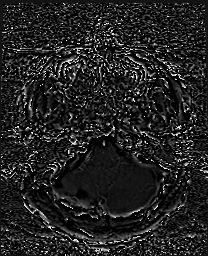
[im 30/59]
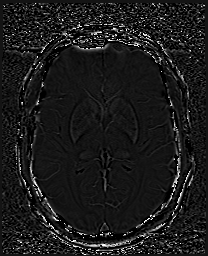
[im 44/59]
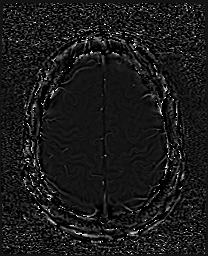
[im 59/59]
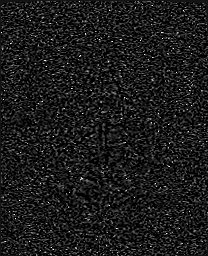

[Series 14: swi_images · axial · 3.0mm · 0.90mm/px · z∈[-88,+88]mm · 5 of 60 slices shown]
[im 1/60]
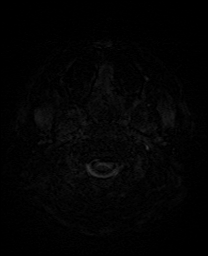
[im 15/60]
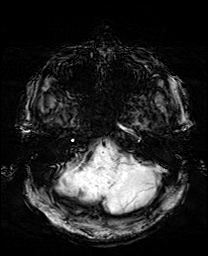
[im 30/60]
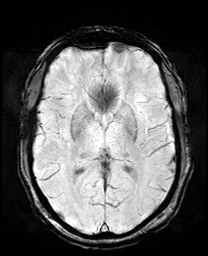
[im 45/60]
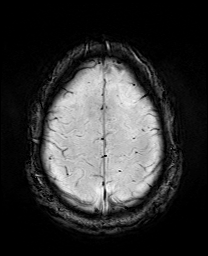
[im 60/60]
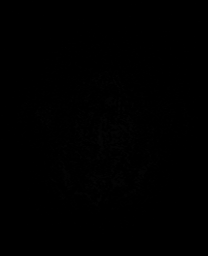

[Series 17: T2 · coronal · 5.0mm · 0.34mm/px · 3 of 31 slices shown (2 of 2)]
[im 1/31]
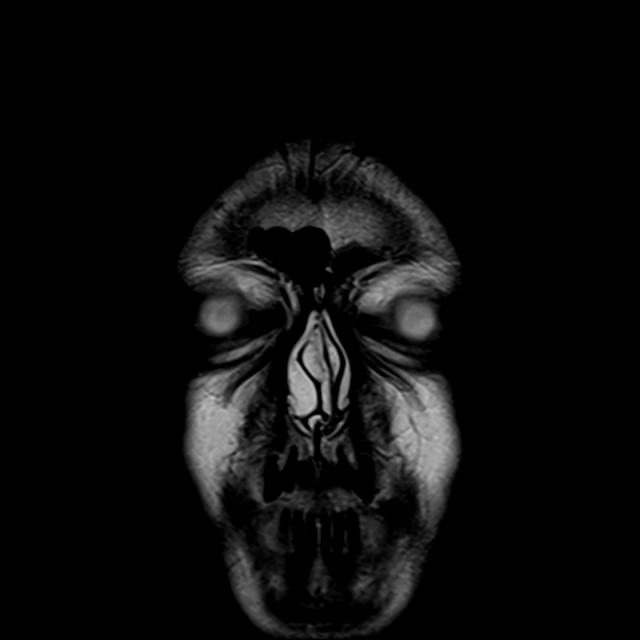
[im 16/31]
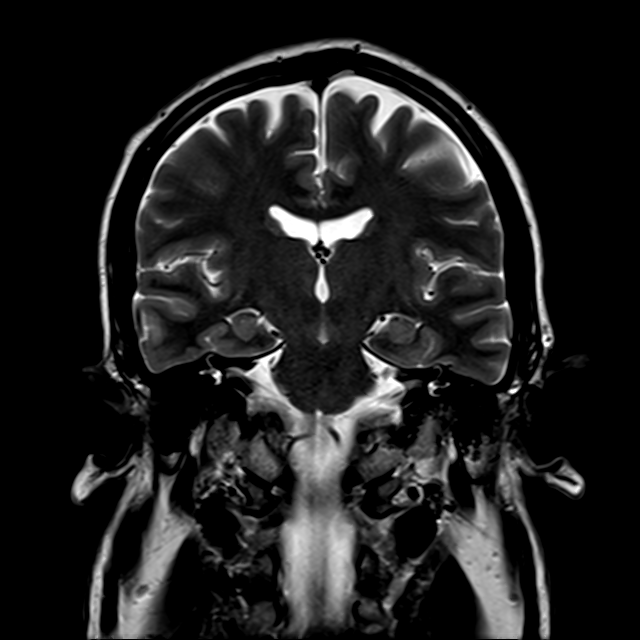
[im 31/31]
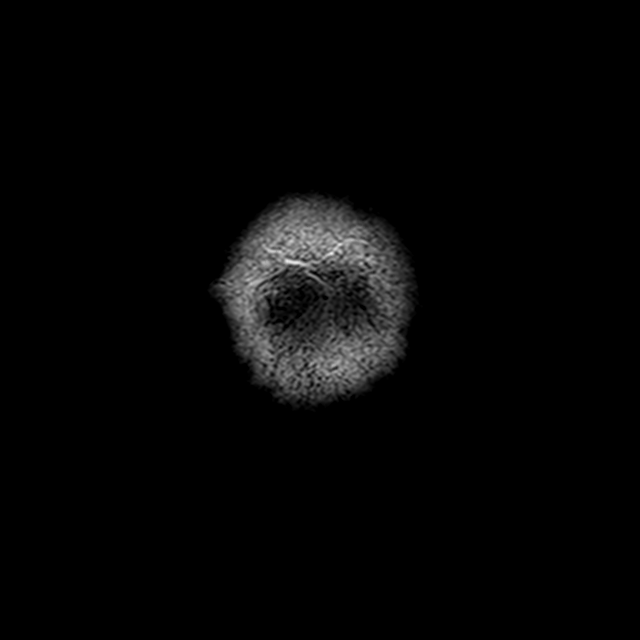

[43 of 48 positions shown; findings below may reference images not displayed]

FINDINGS: BRAIN: There is no acute infarct, acute hemorrhage or extra-axial
collection. The white matter signal is normal for the patient's age.
The cerebral and cerebellar volume are age-appropriate. There is no
hydrocephalus. The midline structures are normal.

VASCULAR: The major intracranial arterial and venous sinus flow
voids are normal. Susceptibility-sensitive sequences show no chronic
microhemorrhage or superficial siderosis.

SKULL AND UPPER CERVICAL SPINE: Calvarial bone marrow signal is
normal. There is no skull base mass. The visualized upper cervical
spine and soft tissues are normal.

SINUSES/ORBITS: There are no fluid levels or advanced mucosal
thickening. The mastoid air cells and middle ear cavities are free
of fluid. The orbits are normal.
IMPRESSION: Normal MRI of the brain.

ADDENDUM:
There is no gliosis surrounding the area of the presumed old
cerebellar infarct described on the earlier CT. Variant sulcation is
an alternative possibility.

*** End of Addendum ***
FINDINGS: BRAIN: There is no acute infarct, acute hemorrhage or extra-axial
collection. The white matter signal is normal for the patient's age.
The cerebral and cerebellar volume are age-appropriate. There is no
hydrocephalus. The midline structures are normal.

VASCULAR: The major intracranial arterial and venous sinus flow
voids are normal. Susceptibility-sensitive sequences show no chronic
microhemorrhage or superficial siderosis.

SKULL AND UPPER CERVICAL SPINE: Calvarial bone marrow signal is
normal. There is no skull base mass. The visualized upper cervical
spine and soft tissues are normal.

SINUSES/ORBITS: There are no fluid levels or advanced mucosal
thickening. The mastoid air cells and middle ear cavities are free
of fluid. The orbits are normal.
IMPRESSION: Normal MRI of the brain.

## 2019-11-14 ENCOUNTER — Encounter (HOSPITAL_COMMUNITY): Payer: Self-pay

## 2019-11-14 ENCOUNTER — Other Ambulatory Visit: Payer: Self-pay

## 2019-11-14 ENCOUNTER — Ambulatory Visit (INDEPENDENT_AMBULATORY_CARE_PROVIDER_SITE_OTHER): Payer: 59

## 2019-11-14 ENCOUNTER — Ambulatory Visit (HOSPITAL_COMMUNITY)
Admission: EM | Admit: 2019-11-14 | Discharge: 2019-11-14 | Disposition: A | Discharge status: Home / Self Care | Payer: 59 | Attending: Emergency Medicine | Admitting: Emergency Medicine

## 2019-11-14 DIAGNOSIS — R1012 Left upper quadrant pain: Secondary | ICD-10-CM | POA: Diagnosis present

## 2019-11-14 DIAGNOSIS — R0789 Other chest pain: Secondary | ICD-10-CM | POA: Diagnosis present

## 2019-11-14 HISTORY — DX: Essential (primary) hypertension: I10

## 2019-11-14 LAB — COMPREHENSIVE METABOLIC PANEL
ALT: 60 U/L — ABNORMAL HIGH (ref 0–44)
AST: 29 U/L (ref 15–41)
Albumin: 3.9 g/dL (ref 3.5–5.0)
Alkaline Phosphatase: 79 U/L (ref 38–126)
Anion gap: 10 (ref 5–15)
BUN: 18 mg/dL (ref 6–20)
CO2: 25 mmol/L (ref 22–32)
Calcium: 9 mg/dL (ref 8.9–10.3)
Chloride: 104 mmol/L (ref 98–111)
Creatinine, Ser: 1.28 mg/dL — ABNORMAL HIGH (ref 0.61–1.24)
GFR calc Af Amer: >60 mL/min (ref >60)
GFR calc non Af Amer: >60 mL/min (ref >60)
Glucose, Bld: 95 mg/dL (ref 70–99)
Potassium: 4.5 mmol/L (ref 3.5–5.1)
Sodium: 139 mmol/L (ref 135–145)
Total Bilirubin: 0.6 mg/dL (ref 0.3–1.2)
Total Protein: 7 g/dL (ref 6.5–8.1)

## 2019-11-14 LAB — CBC WITH DIFFERENTIAL/PLATELET
Abs Immature Granulocytes: 0.06 10*3/uL (ref 0.00–0.07)
Basophils Absolute: 0.1 10*3/uL (ref 0.0–0.1)
Basophils Relative: 1 %
Eosinophils Absolute: 0.2 10*3/uL (ref 0.0–0.5)
Eosinophils Relative: 3 %
HCT: 44.3 % (ref 39.0–52.0)
Hemoglobin: 14.2 g/dL (ref 13.0–17.0)
Immature Granulocytes: 1 %
Lymphocytes Relative: 29 %
Lymphs Abs: 1.8 10*3/uL (ref 0.7–4.0)
MCH: 30.4 pg (ref 26.0–34.0)
MCHC: 32.1 g/dL (ref 30.0–36.0)
MCV: 94.9 fL (ref 80.0–100.0)
Monocytes Absolute: 0.5 10*3/uL (ref 0.1–1.0)
Monocytes Relative: 8 %
Neutro Abs: 3.6 10*3/uL (ref 1.7–7.7)
Neutrophils Relative %: 58 %
Platelets: 301 10*3/uL (ref 150–400)
RBC: 4.67 MIL/uL (ref 4.22–5.81)
RDW: 12.8 % (ref 11.5–15.5)
WBC: 6.2 10*3/uL (ref 4.0–10.5)
nRBC: 0 % (ref 0.0–0.2)

## 2019-11-14 LAB — AMYLASE: Amylase: 52 U/L (ref 28–100)

## 2019-11-14 LAB — LIPASE, BLOOD: Lipase: 28 U/L (ref 11–51)

## 2019-11-14 NOTE — ED Triage Notes (Signed)
C/o left side pain, just below rib cage upon waking yesterday morning. Denies injury/trauma to area. Denies dysuria sx, fever, chills. States pain increase with movement, coughing, sneezing.   Took a "muscle relaxer" last night w/o improvement. States COVID in December and had persistent cough.

## 2019-11-14 NOTE — ED Provider Notes (Signed)
[place]    CSN: [id number] Arrival date & time: 11/14/19  1150      History   Chief Complaint Chief Complaint  Patient presents with  . left side pain    HPI Dale [name] is a 54 y.o. male.   Dale [name] presents with complaints of left lateral chest wall pain which he woke with yesterday. The day prior had a normal day without any specific heavy lifting, injury or strain to the area. Had covid in December and was treated with decadron and inhalers at the time. Cough has since resolved. Pain at rest 2/10. Movement of chest wall such as with sneezing or certain torso movements increases pain to 5/10, or sometimes even 10/10. No urinary symptoms, no abdominal pain, no referred pain, no bruising or discoloration. No shortness of breath , no difficulty breathing. No hemoptysis. No known trauma or injury to chest wall. Took a muscle relaxer this morning which didn't seem to help. Smoking history. No other pulmonary history.    ROS per HPI, negative if not otherwise mentioned.      Past Medical History:  Diagnosis Date  . Hypercholesteremia   . Hypertension   . Mononucleosis     There are no problems to display for this patient.   Past Surgical History:  Procedure Laterality Date  . HERNIA REPAIR    . SHOULDER ARTHROSCOPY         Home Medications    Prior to Admission medications   Medication Sig Start Date End Date Taking? Authorizing Provider  atorvastatin (LIPITOR) 40 MG tablet Take 40 mg by mouth daily.   Yes [provider]  pantoprazole (PROTONIX) 40 MG tablet Take 40 mg by mouth daily.   Yes [provider]  acetaminophen (TYLENOL) 325 MG tablet Take 650 mg by mouth every 6 (six) hours as needed for headache.    [provider]  aspirin EC 325 MG tablet Take 325 mg by mouth every 6 (six) hours as needed for mild pain.    [provider]  CAFFEINE PO Take 1 tablet by mouth as needed (for headache).     [provider]    Family History Family History  Adopted: Yes    Social History Social History   Tobacco Use  . Smoking status: Current Some Day Smoker    Packs/day: 0.50    Types: Cigarettes  . Smokeless tobacco: Current User    Types: Snuff  Substance Use Topics  . Alcohol use: Yes    Alcohol/week: 3.0 standard drinks    Types: 3 Cans of beer per week  . Drug use: No     Allergies   Patient has no known allergies.   Review of Systems Review of Systems   Physical Exam Triage Vital Signs ED Triage Vitals  Enc Vitals Group     BP 11/14/19 1210 (!) 156/93     Pulse Rate 11/14/19 1210 78     Resp 11/14/19 1210 20     Temp 11/14/19 1210 98.1 F (36.7 C)     Temp Source 11/14/19 1210 Oral     SpO2 11/14/19 1210 100 %     Weight --      Height --      Head Circumference --      Peak Flow --      Pain Score 11/14/19 1207 3     Pain Loc --      Pain Edu? --  Excl. in GC? --    No data found.  Updated Vital Signs BP (!) 156/93 (BP Location: Right Arm) Comment: pt states normal DBP is in 90s; SBP norm 130s  Pulse 78   Temp 98.1 F (36.7 C) (Oral)   Resp 20   SpO2 100%    Physical Exam Constitutional:      Appearance: He is well-developed.  Cardiovascular:     Rate and Rhythm: Normal rate.  Pulmonary:     Effort: Pulmonary effort is normal.     Breath sounds: No wheezing or rales.       Comments: Left lateral distal chest wall tenderness at ribs, very specific point tenderness on palpation  Chest:     Chest wall: Tenderness present. No deformity, swelling or crepitus.  Abdominal:     Tenderness: There is abdominal tenderness in the left upper quadrant. There is no guarding or rebound.  Skin:    General: Skin is warm and dry.  Neurological:     Mental Status: He is alert and oriented to person, place, and time.      UC Treatments / Results  Labs (all labs ordered are listed, but only abnormal results are displayed) Labs  Reviewed  COMPREHENSIVE METABOLIC PANEL - Abnormal; Notable for the following components:      Result Value   Creatinine, Ser 1.28 (*)    ALT 60 (*)    All other components within normal limits  CBC WITH DIFFERENTIAL/PLATELET  LIPASE, BLOOD  AMYLASE    EKG   Radiology DG Abd Acute W/Chest  Result Date: 11/14/2019 CLINICAL DATA:  54 year old presenting with acute onset of LEFT-sided abdominal pain that began yesterday. Current smoker with intermittent cough. EXAM: DG ABDOMEN ACUTE W/ 1V CHEST COMPARISON:  Acute abdomen series and CT abdomen and pelvis 11/21/2014. Chest x-ray 09/13/2017. FINDINGS: Bowel gas pattern unremarkable without evidence of obstruction or significant ileus. No evidence of free air or significant air-fluid levels on the erect image. Expected stool burden in the colon. No visible opaque urinary tract calculi. Phlebolith low in the LEFT side of the pelvis as noted previously. Regional skeleton unremarkable. Cardiomediastinal silhouette unremarkable and unchanged. Prominent bronchovascular markings centrally in both lungs and mild to moderate central peribronchial thickening, more so than on the prior exams. Lungs otherwise clear. No localized airspace consolidation. No pleural effusions. No pneumothorax. Normal pulmonary vascularity. IMPRESSION: 1. No acute abdominal abnormality. 2. Mild-to-moderate changes of acute bronchitis and/or asthma without focal airspace pneumonia. Electronically Signed   By: Evangeline Dakin M.D.   On: 11/14/2019 13:06    Procedures Procedures (including critical care time)  Medications Ordered in UC Medications - No data to display  Initial Impression / Assessment and Plan / UC Course  I have reviewed the triage vital signs and the nursing notes.  Pertinent labs & imaging results that were available during my care of the patient were reviewed by me and considered in my medical decision making (see chart for details).     Xray without  acute findings today, some bronchitic changes to lungs, ex-smoker. Has an inhaler at home, no new shortness of breath  Or cough- this has resolved. Point tenderness on palpation to left lateral chest wall, suspect musculoskeletal pain to area. Some LUQ pain with labs obtained, no acute findings with these either. Patient is agreeable to supportive cares over the next few days with er precautions provided for any worsening of symptoms. Return precautions provided. Patient verbalized understanding and agreeable to plan.  MyChart message with results review sent.  Final Clinical Impressions(s) / UC Diagnoses   Final diagnoses:  Chest wall pain  Abdominal pain, left upper quadrant     Discharge Instructions     Your xray is not showing any obvious acute findings, you do have some bronchitic changes to your lungs, use of inhaler as needed, follow up with your PCP with this as needed. I am glad your cough has improved.  It is reassuring that your lateral chest pain is so point specific, more likely to be musculoskeletal- ice, nsaids, muscle relaxer as needed for pain.  I am collecting some labs in consideration of your left upper abdomen pain, will call you if concerned about these, monitor your my chart.  Of course if worsening- increased pain, fevers, shortness of breath , or otherwise worsening please return or go to the ER.     ED Prescriptions    None     PDMP not reviewed this encounter.   Zigmund Gottron, NP 11/14/19 1455

## 2019-11-14 NOTE — Discharge Instructions (Signed)
Your xray is not showing any obvious acute findings, you do have some bronchitic changes to your lungs, use of inhaler as needed, follow up with your PCP with this as needed. I am glad your cough has improved.  It is reassuring that your lateral chest pain is so point specific, more likely to be musculoskeletal- ice, nsaids, muscle relaxer as needed for pain.  I am collecting some labs in consideration of your left upper abdomen pain, will call you if concerned about these, monitor your my chart.  Of course if worsening- increased pain, fevers, shortness of breath , or otherwise worsening please return or go to the ER.

## 2020-03-28 IMAGING — DX DG ABDOMEN ACUTE W/ 1V CHEST
4 series · 4 of 4 positions shown · non-contrast
Comparison: Acute abdomen series and CT abdomen and pelvis
11/21/2014. Chest x-ray 09/13/2017.

CLINICAL DATA: 53-year-old presenting with acute onset of
LEFT-sided abdominal pain that began yesterday. Current smoker with
intermittent cough.

EXAM:
DG ABDOMEN ACUTE W/ 1V CHEST

[chest pa]
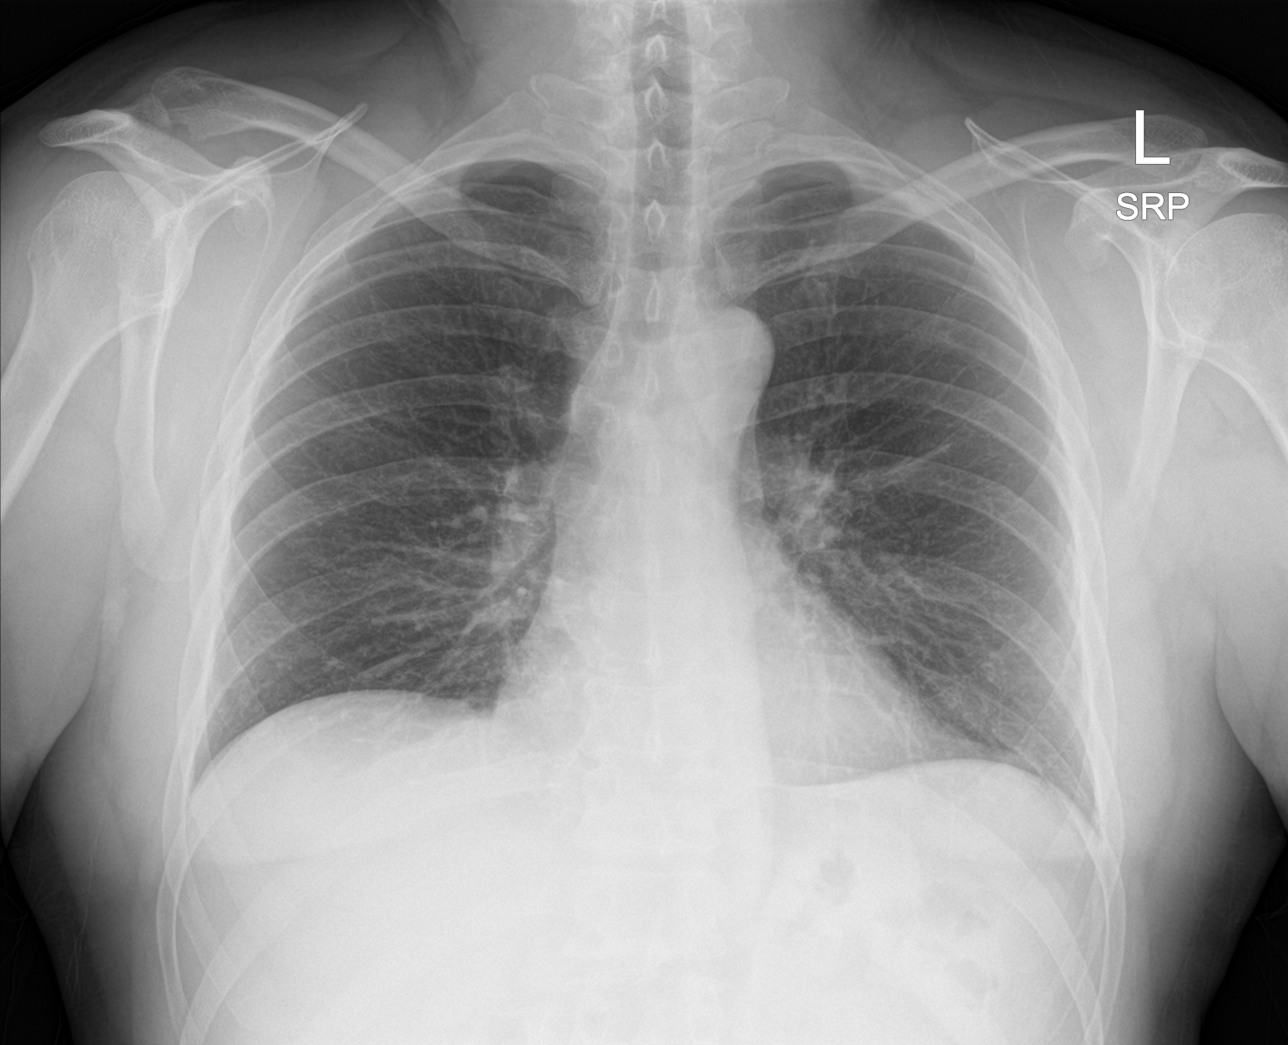

[abdomen erect]
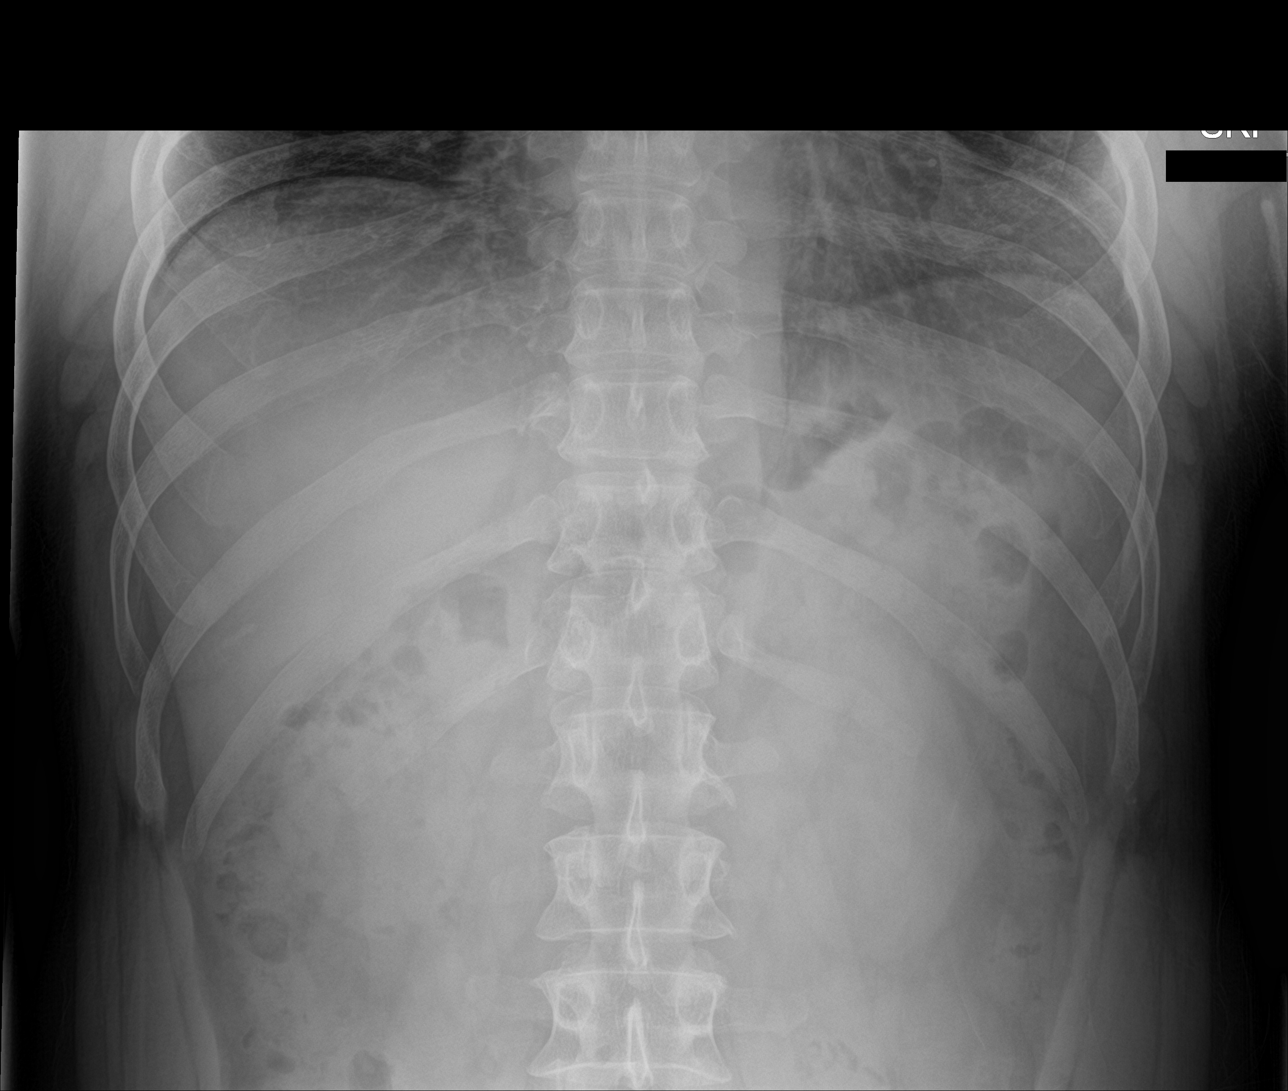

[abdomen supine (1 of 2)]
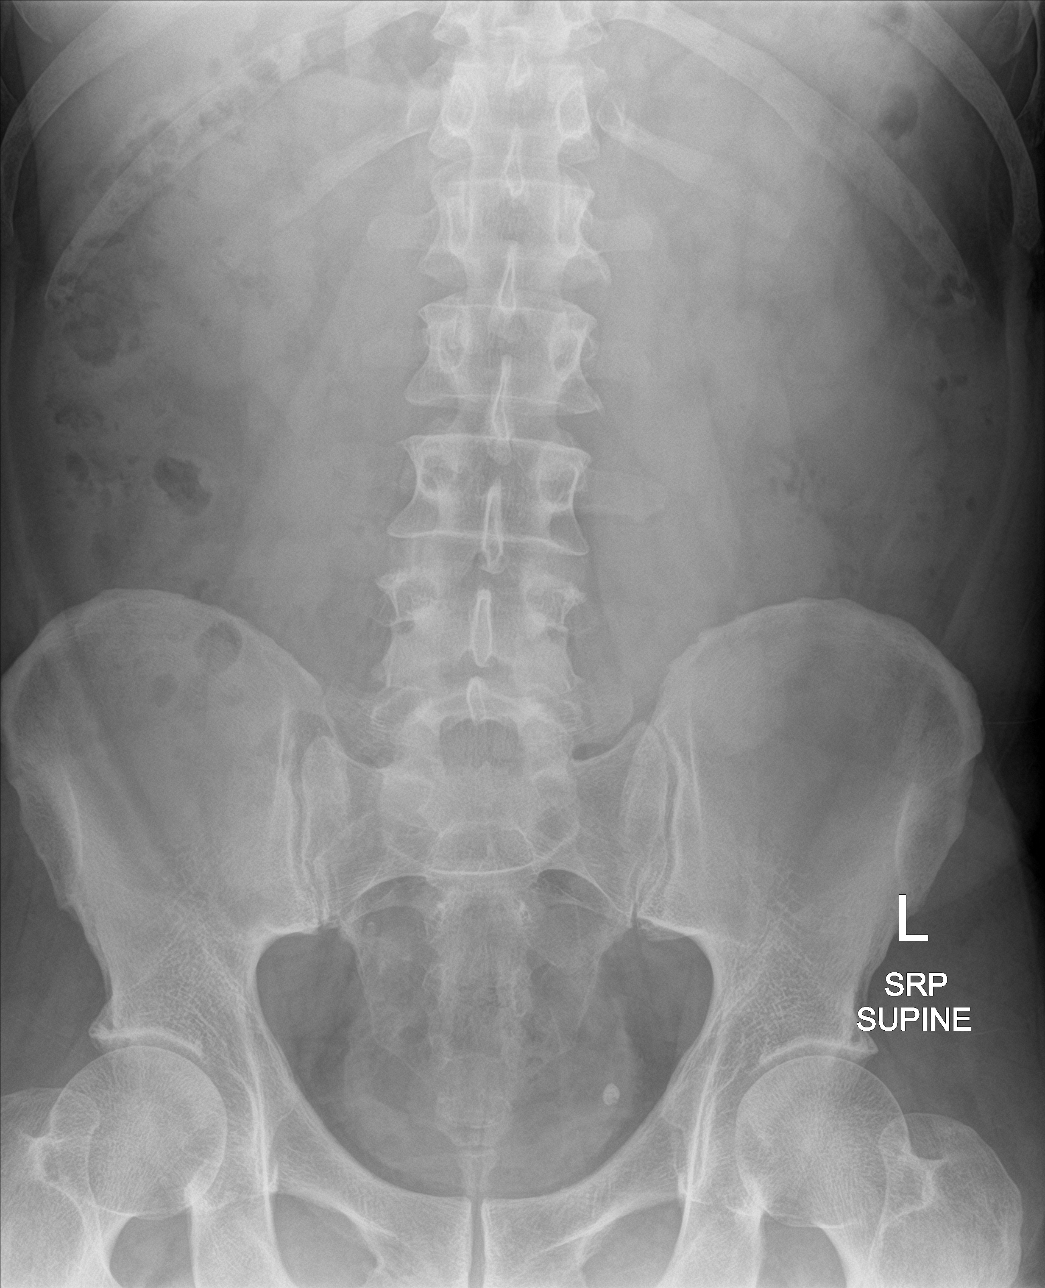

[abdomen supine (2 of 2)]
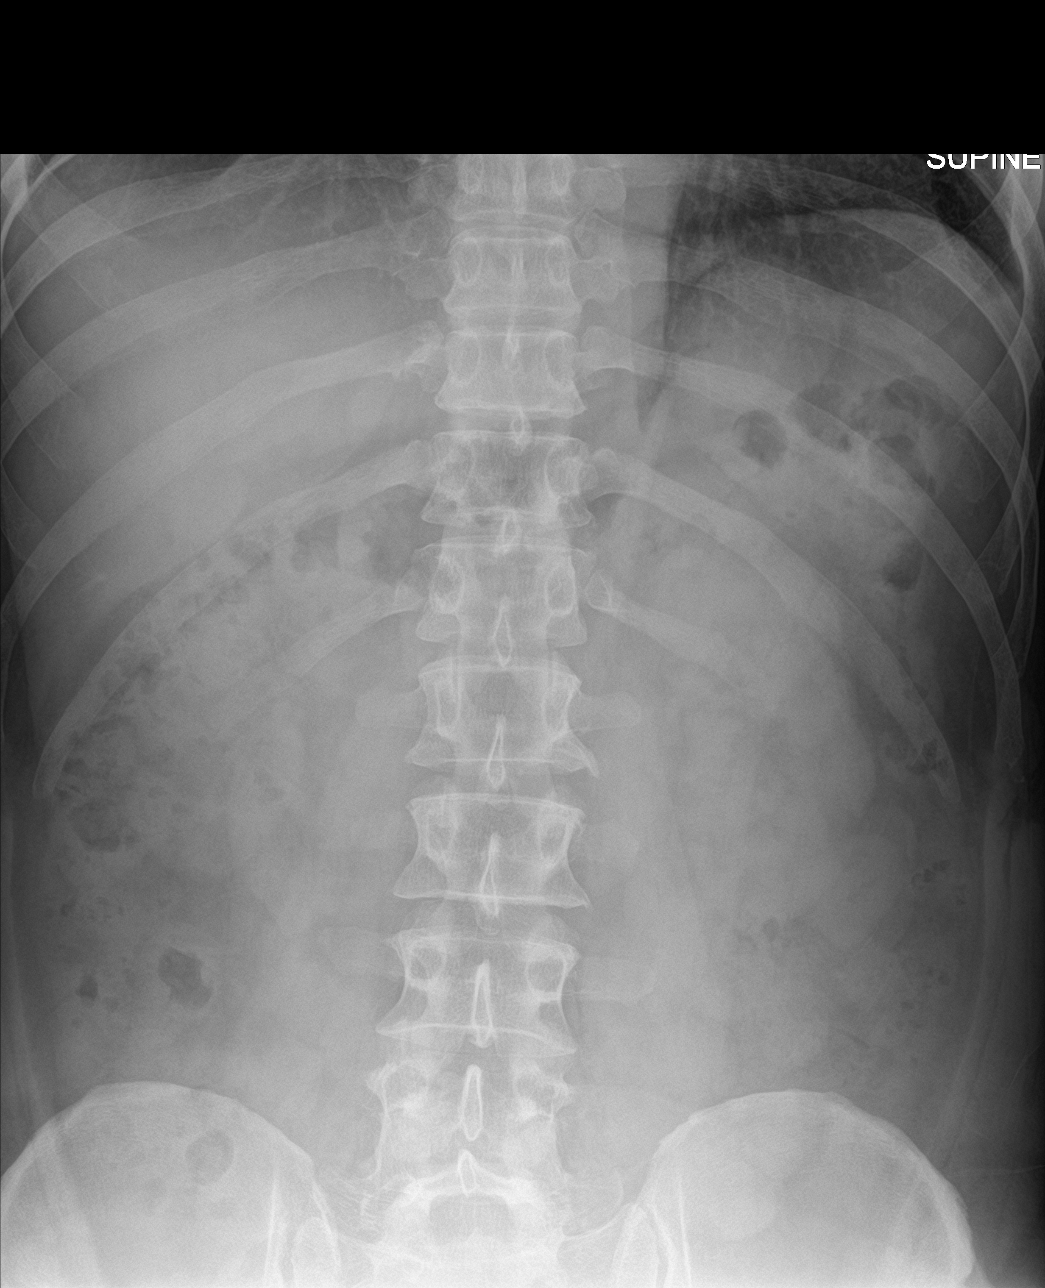

[4 of 4 positions shown; findings below may reference images not displayed]

FINDINGS: Bowel gas pattern unremarkable without evidence of obstruction or
significant ileus. No evidence of free air or significant air-fluid
levels on the erect image. Expected stool burden in the colon. No
visible opaque urinary tract calculi. Phlebolith low in the LEFT
side of the pelvis as noted previously. Regional skeleton
unremarkable.

Cardiomediastinal silhouette unremarkable and unchanged. Prominent
bronchovascular markings centrally in both lungs and mild to
moderate central peribronchial thickening, more so than on the prior
exams. Lungs otherwise clear. No localized airspace consolidation.
No pleural effusions. No pneumothorax. Normal pulmonary vascularity.
IMPRESSION: 1. No acute abdominal abnormality.
2. Mild-to-moderate changes of acute bronchitis and/or asthma
without focal airspace pneumonia.

## 2022-01-09 DIAGNOSIS — G4733 Obstructive sleep apnea (adult) (pediatric): Secondary | ICD-10-CM | POA: Diagnosis not present

## 2022-01-29 DIAGNOSIS — M545 Low back pain, unspecified: Secondary | ICD-10-CM | POA: Diagnosis not present

## 2022-01-31 DIAGNOSIS — G4733 Obstructive sleep apnea (adult) (pediatric): Secondary | ICD-10-CM | POA: Diagnosis not present

## 2022-02-05 DIAGNOSIS — M545 Low back pain, unspecified: Secondary | ICD-10-CM | POA: Diagnosis not present

## 2022-02-19 DIAGNOSIS — M545 Low back pain, unspecified: Secondary | ICD-10-CM | POA: Diagnosis not present

## 2022-03-02 DIAGNOSIS — I10 Essential (primary) hypertension: Secondary | ICD-10-CM | POA: Diagnosis not present

## 2022-03-02 DIAGNOSIS — K219 Gastro-esophageal reflux disease without esophagitis: Secondary | ICD-10-CM | POA: Diagnosis not present

## 2022-03-02 DIAGNOSIS — G4733 Obstructive sleep apnea (adult) (pediatric): Secondary | ICD-10-CM | POA: Diagnosis not present

## 2022-03-02 DIAGNOSIS — E7800 Pure hypercholesterolemia, unspecified: Secondary | ICD-10-CM | POA: Diagnosis not present

## 2022-03-02 DIAGNOSIS — Z0000 Encounter for general adult medical examination without abnormal findings: Secondary | ICD-10-CM | POA: Diagnosis not present

## 2022-03-02 DIAGNOSIS — R030 Elevated blood-pressure reading, without diagnosis of hypertension: Secondary | ICD-10-CM | POA: Diagnosis not present

## 2022-03-06 DIAGNOSIS — M5416 Radiculopathy, lumbar region: Secondary | ICD-10-CM | POA: Diagnosis not present

## 2022-03-12 DIAGNOSIS — M5116 Intervertebral disc disorders with radiculopathy, lumbar region: Secondary | ICD-10-CM | POA: Diagnosis not present

## 2022-03-16 DIAGNOSIS — I10 Essential (primary) hypertension: Secondary | ICD-10-CM | POA: Diagnosis not present

## 2022-03-16 DIAGNOSIS — E7800 Pure hypercholesterolemia, unspecified: Secondary | ICD-10-CM | POA: Diagnosis not present

## 2022-03-21 DIAGNOSIS — M5116 Intervertebral disc disorders with radiculopathy, lumbar region: Secondary | ICD-10-CM | POA: Diagnosis not present

## 2022-04-02 DIAGNOSIS — G4733 Obstructive sleep apnea (adult) (pediatric): Secondary | ICD-10-CM | POA: Diagnosis not present

## 2022-04-08 DIAGNOSIS — S335XXA Sprain of ligaments of lumbar spine, initial encounter: Secondary | ICD-10-CM | POA: Diagnosis not present

## 2022-05-01 DIAGNOSIS — G4733 Obstructive sleep apnea (adult) (pediatric): Secondary | ICD-10-CM | POA: Diagnosis not present

## 2022-07-04 DIAGNOSIS — D123 Benign neoplasm of transverse colon: Secondary | ICD-10-CM | POA: Diagnosis not present

## 2022-07-04 DIAGNOSIS — K644 Residual hemorrhoidal skin tags: Secondary | ICD-10-CM | POA: Diagnosis not present

## 2022-07-04 DIAGNOSIS — D128 Benign neoplasm of rectum: Secondary | ICD-10-CM | POA: Diagnosis not present

## 2022-07-04 DIAGNOSIS — K648 Other hemorrhoids: Secondary | ICD-10-CM | POA: Diagnosis not present

## 2022-07-04 DIAGNOSIS — K5730 Diverticulosis of large intestine without perforation or abscess without bleeding: Secondary | ICD-10-CM | POA: Diagnosis not present

## 2022-07-04 DIAGNOSIS — Z86010 Personal history of colonic polyps: Secondary | ICD-10-CM | POA: Diagnosis not present

## 2022-07-05 ENCOUNTER — Emergency Department (HOSPITAL_COMMUNITY): Payer: BC Managed Care – PPO

## 2022-07-05 ENCOUNTER — Encounter (HOSPITAL_COMMUNITY): Payer: Self-pay | Admitting: Internal Medicine

## 2022-07-05 ENCOUNTER — Other Ambulatory Visit: Payer: Self-pay

## 2022-07-05 ENCOUNTER — Inpatient Hospital Stay (HOSPITAL_COMMUNITY)
Admission: EM | Admit: 2022-07-05 | Discharge: 2022-07-08 | DRG: 392 | Disposition: A | Discharge status: Home / Self Care | Payer: BC Managed Care – PPO | Attending: Internal Medicine | Admitting: Internal Medicine

## 2022-07-05 DIAGNOSIS — I129 Hypertensive chronic kidney disease with stage 1 through stage 4 chronic kidney disease, or unspecified chronic kidney disease: Secondary | ICD-10-CM | POA: Diagnosis not present

## 2022-07-05 DIAGNOSIS — Z7982 Long term (current) use of aspirin: Secondary | ICD-10-CM

## 2022-07-05 DIAGNOSIS — G4733 Obstructive sleep apnea (adult) (pediatric): Secondary | ICD-10-CM | POA: Diagnosis not present

## 2022-07-05 DIAGNOSIS — E7800 Pure hypercholesterolemia, unspecified: Secondary | ICD-10-CM | POA: Diagnosis present

## 2022-07-05 DIAGNOSIS — I10 Essential (primary) hypertension: Secondary | ICD-10-CM | POA: Diagnosis present

## 2022-07-05 DIAGNOSIS — Z79899 Other long term (current) drug therapy: Secondary | ICD-10-CM

## 2022-07-05 DIAGNOSIS — E876 Hypokalemia: Secondary | ICD-10-CM | POA: Diagnosis not present

## 2022-07-05 DIAGNOSIS — R109 Unspecified abdominal pain: Secondary | ICD-10-CM | POA: Diagnosis not present

## 2022-07-05 DIAGNOSIS — I700 Atherosclerosis of aorta: Secondary | ICD-10-CM | POA: Diagnosis not present

## 2022-07-05 DIAGNOSIS — K648 Other hemorrhoids: Secondary | ICD-10-CM | POA: Diagnosis present

## 2022-07-05 DIAGNOSIS — Z9889 Other specified postprocedural states: Secondary | ICD-10-CM

## 2022-07-05 DIAGNOSIS — F17210 Nicotine dependence, cigarettes, uncomplicated: Secondary | ICD-10-CM | POA: Diagnosis present

## 2022-07-05 DIAGNOSIS — K529 Noninfective gastroenteritis and colitis, unspecified: Principal | ICD-10-CM | POA: Diagnosis present

## 2022-07-05 DIAGNOSIS — N182 Chronic kidney disease, stage 2 (mild): Secondary | ICD-10-CM | POA: Diagnosis present

## 2022-07-05 DIAGNOSIS — K219 Gastro-esophageal reflux disease without esophagitis: Secondary | ICD-10-CM | POA: Diagnosis present

## 2022-07-05 DIAGNOSIS — R935 Abnormal findings on diagnostic imaging of other abdominal regions, including retroperitoneum: Secondary | ICD-10-CM | POA: Diagnosis not present

## 2022-07-05 DIAGNOSIS — R1033 Periumbilical pain: Secondary | ICD-10-CM | POA: Diagnosis not present

## 2022-07-05 LAB — COMPREHENSIVE METABOLIC PANEL
ALT: 23 U/L (ref 0–44)
AST: 18 U/L (ref 15–41)
Albumin: 3.7 g/dL (ref 3.5–5.0)
Alkaline Phosphatase: 65 U/L (ref 38–126)
Anion gap: 12 (ref 5–15)
BUN: 13 mg/dL (ref 6–20)
CO2: 18 mmol/L — ABNORMAL LOW (ref 22–32)
Calcium: 8.9 mg/dL (ref 8.9–10.3)
Chloride: 112 mmol/L — ABNORMAL HIGH (ref 98–111)
Creatinine, Ser: 1.15 mg/dL (ref 0.61–1.24)
GFR, Estimated: >60 mL/min (ref >60)
Glucose, Bld: 140 mg/dL — ABNORMAL HIGH (ref 70–99)
Potassium: 3.7 mmol/L (ref 3.5–5.1)
Sodium: 142 mmol/L (ref 135–145)
Total Bilirubin: 1.2 mg/dL (ref 0.3–1.2)
Total Protein: 6.6 g/dL (ref 6.5–8.1)

## 2022-07-05 LAB — URINALYSIS, ROUTINE W REFLEX MICROSCOPIC
Bilirubin Urine: NEGATIVE
Glucose, UA: NEGATIVE mg/dL
Hgb urine dipstick: NEGATIVE
Ketones, ur: NEGATIVE mg/dL
Leukocytes,Ua: NEGATIVE
Nitrite: NEGATIVE
Protein, ur: NEGATIVE mg/dL
Specific Gravity, Urine: 1.027 (ref 1.005–1.030)
pH: 5 (ref 5.0–8.0)

## 2022-07-05 LAB — CBC WITH DIFFERENTIAL/PLATELET
Abs Immature Granulocytes: 0.09 10*3/uL — ABNORMAL HIGH (ref 0.00–0.07)
Basophils Absolute: 0 10*3/uL (ref 0.0–0.1)
Basophils Relative: 0 %
Eosinophils Absolute: 0 10*3/uL (ref 0.0–0.5)
Eosinophils Relative: 0 %
HCT: 42.3 % (ref 39.0–52.0)
Hemoglobin: 13.9 g/dL (ref 13.0–17.0)
Immature Granulocytes: 1 %
Lymphocytes Relative: 5 %
Lymphs Abs: 0.9 10*3/uL (ref 0.7–4.0)
MCH: 31.6 pg (ref 26.0–34.0)
MCHC: 32.9 g/dL (ref 30.0–36.0)
MCV: 96.1 fL (ref 80.0–100.0)
Monocytes Absolute: 0.5 10*3/uL (ref 0.1–1.0)
Monocytes Relative: 3 %
Neutro Abs: 16.3 10*3/uL — ABNORMAL HIGH (ref 1.7–7.7)
Neutrophils Relative %: 91 %
Platelets: 234 10*3/uL (ref 150–400)
RBC: 4.4 MIL/uL (ref 4.22–5.81)
RDW: 12.4 % (ref 11.5–15.5)
WBC: 17.8 10*3/uL — ABNORMAL HIGH (ref 4.0–10.5)
nRBC: 0 % (ref 0.0–0.2)

## 2022-07-05 LAB — LIPASE, BLOOD: Lipase: 24 U/L (ref 11–51)

## 2022-07-05 MED ORDER — PIPERACILLIN-TAZOBACTAM 3.375 G IVPB
3.3750 g | Freq: Once | INTRAVENOUS | Status: AC
  Administered 2022-07-05: 3.375 g via INTRAVENOUS
  Filled 2022-07-05: qty 50

## 2022-07-05 MED ORDER — MORPHINE SULFATE (PF) 2 MG/ML IV SOLN
4.0000 mg | Freq: Once | INTRAVENOUS | Status: DC
  Administered 2022-07-05: 4 mg via INTRAVENOUS
  Filled 2022-07-05: qty 2

## 2022-07-05 MED ORDER — HYDRALAZINE HCL 20 MG/ML IJ SOLN: 10.0000 mg | INTRAMUSCULAR | Status: DC | PRN

## 2022-07-05 MED ORDER — PANTOPRAZOLE SODIUM 40 MG PO TBEC
40.0000 mg | DELAYED_RELEASE_TABLET | Freq: Every day | ORAL | Status: DC
  Administered 2022-07-05 – 2022-07-08 (×4): 40 mg via ORAL
  Filled 2022-07-05 (×4): qty 1

## 2022-07-05 MED ORDER — HYDROMORPHONE HCL 1 MG/ML IJ SOLN: 1.0000 mg | Freq: Once | INTRAMUSCULAR | Status: DC

## 2022-07-05 MED ORDER — ONDANSETRON 4 MG PO TBDP: 4.0000 mg | ORAL_TABLET | Freq: Four times a day (QID) | ORAL | Status: DC | PRN

## 2022-07-05 MED ORDER — HYDROCODONE-ACETAMINOPHEN 5-325 MG PO TABS: 1.0000 | ORAL_TABLET | Freq: Once | ORAL | Status: DC

## 2022-07-05 MED ORDER — HYDROMORPHONE HCL 1 MG/ML IJ SOLN
1.0000 mg | Freq: Once | INTRAMUSCULAR | Status: AC
  Administered 2022-07-05: 1 mg via INTRAVENOUS
  Filled 2022-07-05: qty 1

## 2022-07-05 MED ORDER — MORPHINE SULFATE (PF) 2 MG/ML IV SOLN
2.0000 mg | INTRAVENOUS | Status: DC | PRN
  Administered 2022-07-05: 2 mg via INTRAVENOUS
  Filled 2022-07-05: qty 1

## 2022-07-05 MED ORDER — ONDANSETRON 4 MG PO TBDP: 8.0000 mg | ORAL_TABLET | Freq: Once | ORAL | Status: DC

## 2022-07-05 MED ORDER — ATORVASTATIN CALCIUM 80 MG PO TABS
80.0000 mg | ORAL_TABLET | Freq: Every day | ORAL | Status: DC
  Administered 2022-07-06 – 2022-07-08 (×3): 80 mg via ORAL
  Filled 2022-07-05 (×3): qty 1

## 2022-07-05 MED ORDER — ONDANSETRON HCL 4 MG/2ML IJ SOLN
4.0000 mg | Freq: Four times a day (QID) | INTRAMUSCULAR | Status: DC | PRN
  Administered 2022-07-05: 4 mg via INTRAVENOUS
  Filled 2022-07-05: qty 2

## 2022-07-05 MED ORDER — ONDANSETRON HCL 4 MG/2ML IJ SOLN
4.0000 mg | Freq: Once | INTRAMUSCULAR | Status: AC
  Administered 2022-07-05: 4 mg via INTRAVENOUS
  Filled 2022-07-05: qty 2

## 2022-07-05 MED ORDER — ACETAMINOPHEN 650 MG RE SUPP: 650.0000 mg | Freq: Four times a day (QID) | RECTAL | Status: DC | PRN

## 2022-07-05 MED ORDER — PIPERACILLIN-TAZOBACTAM 3.375 G IVPB
3.3750 g | Freq: Three times a day (TID) | INTRAVENOUS | Status: DC
  Administered 2022-07-05 – 2022-07-08 (×8): 3.375 g via INTRAVENOUS
  Filled 2022-07-05 (×6): qty 50

## 2022-07-05 MED ORDER — LACTATED RINGERS IV BOLUS
1000.0000 mL | Freq: Once | INTRAVENOUS | Status: AC
  Administered 2022-07-05: 1000 mL via INTRAVENOUS

## 2022-07-05 MED ORDER — LOSARTAN POTASSIUM 50 MG PO TABS
50.0000 mg | ORAL_TABLET | Freq: Every day | ORAL | Status: DC
  Administered 2022-07-06 – 2022-07-08 (×3): 50 mg via ORAL
  Filled 2022-07-05 (×3): qty 1

## 2022-07-05 MED ORDER — IOHEXOL 350 MG/ML SOLN
100.0000 mL | Freq: Once | INTRAVENOUS | Status: AC | PRN
  Administered 2022-07-05: 100 mL via INTRAVENOUS

## 2022-07-05 MED ORDER — ACETAMINOPHEN 325 MG PO TABS
650.0000 mg | ORAL_TABLET | Freq: Four times a day (QID) | ORAL | Status: DC | PRN
  Administered 2022-07-05: 650 mg via ORAL
  Filled 2022-07-05: qty 2

## 2022-07-05 MED ORDER — LACTATED RINGERS IV SOLN
INTRAVENOUS | Status: DC
  Administered 2022-07-06: 75 mL/h via INTRAVENOUS

## 2022-07-05 MED ORDER — AMLODIPINE BESYLATE 5 MG PO TABS
5.0000 mg | ORAL_TABLET | Freq: Every day | ORAL | Status: DC
  Administered 2022-07-06 – 2022-07-08 (×3): 5 mg via ORAL
  Filled 2022-07-05 (×3): qty 1

## 2022-07-05 MED ORDER — OXYCODONE HCL 5 MG PO TABS
5.0000 mg | ORAL_TABLET | ORAL | Status: DC | PRN
  Administered 2022-07-06: 10 mg via ORAL
  Filled 2022-07-05: qty 2

## 2022-07-05 NOTE — ED Provider Triage Note (Signed)
Emergency Medicine Provider Triage Evaluation Note  Dale [name] , a 56 y.o. male  was evaluated in triage.  Pt complains of abdominal discomfort since last evening.  He had a colonoscopy done yesterday.  Pain is intermittent and severe.  He is unable to find a comfortable position during my exam.  Significant tenderness palpation of the abdomen.  Denies blood in his stools.  Denies emesis.  Review of Systems  Positive: As above Negative: As above  Physical Exam  BP 104/74 (BP Location: Right Arm)   Pulse (!) 109   Temp 99.3 F (37.4 C) (Oral)   Resp 20   SpO2 100%  Gen:   Awake, no distress   Resp:  Normal effort  MSK:   Moves extremities without difficulty  Other:  Generalized tenderness of the abdomen.  Medical Decision Making  Medically screening exam initiated at 10:23 AM.  Appropriate orders placed.  [name] was informed that the remainder of the evaluation will be completed by another provider, this initial triage assessment does not replace that evaluation, and the importance of remaining in the ED until their evaluation is complete.  Patient sent to the emergency room by his gastroenterologist for concern and evaluation of perforation.  Case was discussed by gastroenterologist with Dr. Roderic Palau.  CT scan was previously ordered.  Discussed with nurse that patient needs to be prioritized for a room given his presentation.   Evlyn Courier, PA-C 07/05/22 1026

## 2022-07-05 NOTE — Consult Note (Signed)
CC/Reason for consult: "Colitis after colonoscopy yesterday"  Requesting physician: Wilford Corner, MD  HPI: Dale [name] is an 56 y.o. male with hx of HTN, HLD underwent colonoscopy yesterday with [facility], Dr. Therisa Doyne.  Following the procedure, he reports he felt fine but over the ensuing hours developed severe midepigastric pain.  The pain did not radiate.  He did note some pain across his right lateral abdomen as well.  Came into the ER this morning.  I was asked to see him this evening.  He reports colonscopy was for surveillance for personal history of polyps with prior being ~3 yrs ago.   Discussed case with Dr. Michail Sermon as per their notes (colonoscopy is outside [facility]) - "Four small polyps removed with cold biopsy forceps (2 from transverse, one from hepatic flexure, and one from rectum"  He reports that he has had midepigastric and right-sided abdominal pain which has been noticed since afternoon yesterday.  It has improved somewhat since receiving IV fluids and IV antibiotics.  He denies any nausea or vomiting. He denies any apparent blood in stool - has had BM since procedure and it was dark brown liquidy stool.  Reported last night he did have some [place] food for dinner.  He denies any prior history of colitis.  He reports his family history is unknown as he was adopted.    Past Medical History:  Diagnosis Date   Hypercholesteremia    Hypertension    Mononucleosis     Past Surgical History:  Procedure Laterality Date   HERNIA REPAIR     SHOULDER ARTHROSCOPY      Family History  Adopted: Yes    Social:  reports that he has been smoking cigarettes. He has been smoking an average of .5 packs per day. His smokeless tobacco use includes snuff. He reports current alcohol use of about 3.0 standard drinks of alcohol per week. He reports that he does not use drugs.  Allergies: No Known Allergies  Medications: I have reviewed the patient's current  medications.  Results for orders placed or performed during the hospital encounter of 07/05/22 (from the past 48 hour(s))  CBC with Differential     Status: Abnormal   Collection Time: 07/05/22  9:52 AM  Result Value Ref Range   WBC 17.8 (H) 4.0 - 10.5 K/uL   RBC 4.40 4.22 - 5.81 MIL/uL   Hemoglobin 13.9 13.0 - 17.0 g/dL   HCT 42.3 39.0 - 52.0 %   MCV 96.1 80.0 - 100.0 fL   MCH 31.6 26.0 - 34.0 pg   MCHC 32.9 30.0 - 36.0 g/dL   RDW 12.4 11.5 - 15.5 %   Platelets 234 150 - 400 K/uL   nRBC 0.0 0.0 - 0.2 %   Neutrophils Relative % 91 %   Neutro Abs 16.3 (H) 1.7 - 7.7 K/uL   Lymphocytes Relative 5 %   Lymphs Abs 0.9 0.7 - 4.0 K/uL   Monocytes Relative 3 %   Monocytes Absolute 0.5 0.1 - 1.0 K/uL   Eosinophils Relative 0 %   Eosinophils Absolute 0.0 0.0 - 0.5 K/uL   Basophils Relative 0 %   Basophils Absolute 0.0 0.0 - 0.1 K/uL   Immature Granulocytes 1 %   Abs Immature Granulocytes 0.09 (H) 0.00 - 0.07 K/uL    Comment: Performed at [facility], [address] [place], [place] [address]  Comprehensive metabolic panel     Status: Abnormal   Collection Time: 07/05/22  9:52  AM  Result Value Ref Range   Sodium 142 135 - 145 mmol/L   Potassium 3.7 3.5 - 5.1 mmol/L   Chloride 112 (H) 98 - 111 mmol/L   CO2 18 (L) 22 - 32 mmol/L   Glucose, Bld 140 (H) 70 - 99 mg/dL    Comment: Glucose reference range applies only to samples taken after fasting for at least 8 hours.   BUN 13 6 - 20 mg/dL   Creatinine, Ser 1.15 0.61 - 1.24 mg/dL   Calcium 8.9 8.9 - 10.3 mg/dL   Total Protein 6.6 6.5 - 8.1 g/dL   Albumin 3.7 3.5 - 5.0 g/dL   AST 18 15 - 41 U/L   ALT 23 0 - 44 U/L   Alkaline Phosphatase 65 38 - 126 U/L   Total Bilirubin 1.2 0.3 - 1.2 mg/dL   GFR, Estimated >60 >60 mL/min    Comment: (NOTE) Calculated using the CKD-EPI Creatinine Equation (2021)    Anion gap 12 5 - 15    Comment: Performed at [facility] [address], [place], [place] [address]  Lipase, blood      Status: None   Collection Time: 07/05/22  9:52 AM  Result Value Ref Range   Lipase 24 11 - 51 U/L    Comment: Performed at [facility] [address] [place], [place] [address]  Urinalysis, Routine w reflex microscopic     Status: None   Collection Time: 07/05/22 11:09 AM  Result Value Ref Range   Color, Urine YELLOW YELLOW   APPearance CLEAR CLEAR   Specific Gravity, Urine 1.027 1.005 - 1.030   pH 5.0 5.0 - 8.0   Glucose, UA NEGATIVE NEGATIVE mg/dL   Hgb urine dipstick NEGATIVE NEGATIVE   Bilirubin Urine NEGATIVE NEGATIVE   Ketones, ur NEGATIVE NEGATIVE mg/dL   Protein, ur NEGATIVE NEGATIVE mg/dL   Nitrite NEGATIVE NEGATIVE   Leukocytes,Ua NEGATIVE NEGATIVE    Comment: Performed at [facility] [address], [place], [place] [address]    CT ABDOMEN PELVIS W CONTRAST  Result Date: 07/05/2022 CLINICAL DATA:  Abdominal pain. EXAM: CT ABDOMEN AND PELVIS WITH CONTRAST TECHNIQUE: Multidetector CT imaging of the abdomen and pelvis was performed using the standard protocol following bolus administration of intravenous contrast. RADIATION DOSE REDUCTION: This exam was performed according to the departmental dose-optimization program which includes automated exposure control, adjustment of the mA and/or kV according to patient size and/or use of iterative reconstruction technique. CONTRAST:  15m OMNIPAQUE IOHEXOL 350 MG/ML SOLN COMPARISON:  CT examination dated November 21, 2014 FINDINGS: Lower chest: Bibasilar dependent atelectasis. Hepatobiliary: No focal liver abnormality is seen. No gallstones, gallbladder wall thickening, or biliary dilatation. Pancreas: Unremarkable. No pancreatic ductal dilatation or surrounding inflammatory changes. Spleen: Normal in size without focal abnormality. Adrenals/Urinary Tract: Adrenal glands are unremarkable. Kidneys are normal, without renal calculi, focal lesion, or hydronephrosis. Bladder is unremarkable. Stomach/Bowel: Stomach is within  normal limits. Appendix appears normal. There is marked thickening of the cecum, ascending colon and proximal half of the transverse colon suggesting severe colitis with mild adjacent inflammatory changes. No evidence of pneumatosis. Distal transverse colon, descending and sigmoid colon are unremarkable. Vascular/Lymphatic: Mild aortic atherosclerosis. No enlarged abdominal or pelvic lymph nodes. Reproductive: Prostate is unremarkable. Other: No abdominal wall hernia or abnormality. No abdominopelvic ascites. Musculoskeletal: No acute or significant osseous findings. IMPRESSION: 1. Marked thickening of the cecum, ascending colon and proximal half of the transverse colon suggesting severe colitis  with mild adjacent inflammatory changes. No evidence of pneumatosis or extraluminal free air. 2. Appendix is normal. 3. No evidence of nephrolithiasis or hydronephrosis. Electronically Signed   By: Keane Police D.O.   On: 07/05/2022 12:23    ROS - all of the below systems have been reviewed with the patient and positives are indicated with bold text General: chills, fever or night sweats Eyes: blurry vision or double vision ENT: epistaxis or sore throat Allergy/Immunology: itchy/watery eyes or nasal congestion Hematologic/Lymphatic: bleeding problems, blood clots or swollen lymph nodes Endocrine: temperature intolerance or unexpected weight changes Breast: new or changing breast lumps or nipple discharge Resp: cough, shortness of breath, or wheezing CV: chest pain or dyspnea on exertion GI: as per HPI GU: dysuria, trouble voiding, or hematuria MSK: joint pain or joint stiffness Neuro: TIA or stroke symptoms Derm: pruritus and skin lesion changes Psych: anxiety and depression  PE Blood pressure 115/80, pulse 93, temperature 99.3 F (37.4 C), temperature source Oral, resp. rate 16, height '5\' 8"'$  (1.727 m), weight 83.9 kg, SpO2 95 %. Constitutional: Currently in no acute distress; conversant Eyes: Moist  conjunctiva Lungs: Normal respiratory effort CV: RRR; no pitting edema GI: Abd soft, quite tender across MEG and Right hemiabdomen; not significantly distended MSK: Normal range of motion of extremities Psychiatric: Appropriate affect; alert and oriented x3   Results for orders placed or performed during the hospital encounter of 07/05/22 (from the past 48 hour(s))  CBC with Differential     Status: Abnormal   Collection Time: 07/05/22  9:52 AM  Result Value Ref Range   WBC 17.8 (H) 4.0 - 10.5 K/uL   RBC 4.40 4.22 - 5.81 MIL/uL   Hemoglobin 13.9 13.0 - 17.0 g/dL   HCT 42.3 39.0 - 52.0 %   MCV 96.1 80.0 - 100.0 fL   MCH 31.6 26.0 - 34.0 pg   MCHC 32.9 30.0 - 36.0 g/dL   RDW 12.4 11.5 - 15.5 %   Platelets 234 150 - 400 K/uL   nRBC 0.0 0.0 - 0.2 %   Neutrophils Relative % 91 %   Neutro Abs 16.3 (H) 1.7 - 7.7 K/uL   Lymphocytes Relative 5 %   Lymphs Abs 0.9 0.7 - 4.0 K/uL   Monocytes Relative 3 %   Monocytes Absolute 0.5 0.1 - 1.0 K/uL   Eosinophils Relative 0 %   Eosinophils Absolute 0.0 0.0 - 0.5 K/uL   Basophils Relative 0 %   Basophils Absolute 0.0 0.0 - 0.1 K/uL   Immature Granulocytes 1 %   Abs Immature Granulocytes 0.09 (H) 0.00 - 0.07 K/uL    Comment: Performed at [facility], [address], [place], [place] [address]  Comprehensive metabolic panel     Status: Abnormal   Collection Time: 07/05/22  9:52 AM  Result Value Ref Range   Sodium 142 135 - 145 mmol/L   Potassium 3.7 3.5 - 5.1 mmol/L   Chloride 112 (H) 98 - 111 mmol/L   CO2 18 (L) 22 - 32 mmol/L   Glucose, Bld 140 (H) 70 - 99 mg/dL    Comment: Glucose reference range applies only to samples taken after fasting for at least 8 hours.   BUN 13 6 - 20 mg/dL   Creatinine, Ser 1.15 0.61 - 1.24 mg/dL   Calcium 8.9 8.9 - 10.3 mg/dL   Total Protein 6.6 6.5 - 8.1 g/dL   Albumin 3.7 3.5 - 5.0 g/dL   AST 18 15 - 41 U/L  ALT 23 0 - 44 U/L   Alkaline Phosphatase 65 38 - 126 U/L   Total Bilirubin 1.2 0.3 - 1.2  mg/dL   GFR, Estimated >60 >60 mL/min    Comment: (NOTE) Calculated using the CKD-EPI Creatinine Equation (2021)    Anion gap 12 5 - 15    Comment: Performed at [facility] [address] [place], [place] [address]  Lipase, blood     Status: None   Collection Time: 07/05/22  9:52 AM  Result Value Ref Range   Lipase 24 11 - 51 U/L    Comment: Performed at [facility] [address], [place], [place] [address]  Urinalysis, Routine w reflex microscopic     Status: None   Collection Time: 07/05/22 11:09 AM  Result Value Ref Range   Color, Urine YELLOW YELLOW   APPearance CLEAR CLEAR   Specific Gravity, Urine 1.027 1.005 - 1.030   pH 5.0 5.0 - 8.0   Glucose, UA NEGATIVE NEGATIVE mg/dL   Hgb urine dipstick NEGATIVE NEGATIVE   Bilirubin Urine NEGATIVE NEGATIVE   Ketones, ur NEGATIVE NEGATIVE mg/dL   Protein, ur NEGATIVE NEGATIVE mg/dL   Nitrite NEGATIVE NEGATIVE   Leukocytes,Ua NEGATIVE NEGATIVE    Comment: Performed at [facility] [address], [place], [place] [address]    CT ABDOMEN PELVIS W CONTRAST  Result Date: 07/05/2022 CLINICAL DATA:  Abdominal pain. EXAM: CT ABDOMEN AND PELVIS WITH CONTRAST TECHNIQUE: Multidetector CT imaging of the abdomen and pelvis was performed using the standard protocol following bolus administration of intravenous contrast. RADIATION DOSE REDUCTION: This exam was performed according to the departmental dose-optimization program which includes automated exposure control, adjustment of the mA and/or kV according to patient size and/or use of iterative reconstruction technique. CONTRAST:  127m OMNIPAQUE IOHEXOL 350 MG/ML SOLN COMPARISON:  CT examination dated November 21, 2014 FINDINGS: Lower chest: Bibasilar dependent atelectasis. Hepatobiliary: No focal liver abnormality is seen. No gallstones, gallbladder wall thickening, or biliary dilatation. Pancreas: Unremarkable. No pancreatic ductal dilatation or surrounding inflammatory  changes. Spleen: Normal in size without focal abnormality. Adrenals/Urinary Tract: Adrenal glands are unremarkable. Kidneys are normal, without renal calculi, focal lesion, or hydronephrosis. Bladder is unremarkable. Stomach/Bowel: Stomach is within normal limits. Appendix appears normal. There is marked thickening of the cecum, ascending colon and proximal half of the transverse colon suggesting severe colitis with mild adjacent inflammatory changes. No evidence of pneumatosis. Distal transverse colon, descending and sigmoid colon are unremarkable. Vascular/Lymphatic: Mild aortic atherosclerosis. No enlarged abdominal or pelvic lymph nodes. Reproductive: Prostate is unremarkable. Other: No abdominal wall hernia or abnormality. No abdominopelvic ascites. Musculoskeletal: No acute or significant osseous findings. IMPRESSION: 1. Marked thickening of the cecum, ascending colon and proximal half of the transverse colon suggesting severe colitis with mild adjacent inflammatory changes. No evidence of pneumatosis or extraluminal free air. 2. Appendix is normal. 3. No evidence of nephrolithiasis or hydronephrosis. Electronically Signed   By: IKeane PoliceD.O.   On: 07/05/2022 12:23     A/P: Dale GAUMERis an 56y.o. male s/p colonoscopy yesterday - cold forcep polypectomy x 4; now with significant colitis involving right colon  -Etiology is not clear at present. There is no evidence of perforation - no free air nor free fluid. He does have leukocytosis and tenderness on exam. -Agree with plans noted by GI with supportive care including IVF, NPO/bowel rest, Empiric IV antibiotics -We will follow with you as well - please let  us know if any acute changes or worsening in interim as well -I discussed the above with him, his questions were answered and he voiced understanding and agreement with the plan -Above reviewed with Dr. Michail Sermon over the phone this evening  I spent a total of 65 minutes in both  face-to-face and non-face-to-face activities, excluding procedures performed, for this visit on the date of this encounter.  Nadeen Landau, [facility], [place]

## 2022-07-05 NOTE — Consult Note (Signed)
Referring Provider: [facility] Primary Care Physician:  Johna Roles, PA Primary Gastroenterologist:  Dr. Therisa Doyne  Reason for Consultation:  Abdominal pain  HPI: Dale [name] is a 56 y.o. male with medical history of HTN who presented to the [facility] ED for evaluation of abdominal pain shortly after colonoscopy with polypectomy.   Patient reports he had surveillance colonoscopy with Dr. Therisa Doyne yesterday morning.  After the procedure he had no abdominal pain.  He was able to eat after the colonoscopy.  For dinner he had [place] food.  About an hour after dinner he began having abdominal pain, mostly in the mid abdomen and epigastric region.  Notes the pain was constant but changed in intensity over time.  His pain ranged from a 3-4/10 to a 9/10 at its worst.  Pain did not radiate. He noted slight improvement in symptoms when laying in fetal position. The intense pain increased in frequency in the morning so he called [facility] on-call Dr. Michail Sermon who recommended he present to the ED for urgent CT scan.   On evaluation in the ED, Hgb 13.9, WBC 17.8, normal CMP, normal lipase. CT AP w/ contrast with thickening of the cecum, ascending colon and proximal half of the transverse colon suggesting severe colitis, no bowel perforation. ED started IV fluid bolus, Zosyn, and pain control.   Colonoscopy 07/04/2022 4 sessile polyps in the transverse colon, hepatic flexure, rectum polyps were 3 to 6 mm in size, polyps removed with cold biopsy forceps.  Scattered small mouth diverticula, internal nonbleeding hemorrhoids.  Past Medical History:  Diagnosis Date   Hypercholesteremia    Hypertension    Mononucleosis     Past Surgical History:  Procedure Laterality Date   HERNIA REPAIR     SHOULDER ARTHROSCOPY      Prior to Admission medications   Medication Sig Start Date End Date Taking? Authorizing Provider  acetaminophen (TYLENOL) 325 MG tablet Take 650 mg by mouth every 6 (six) hours as needed for headache.     [provider]  amLODipine (NORVASC) 5 MG tablet Take 5 mg by mouth daily. 06/21/22   [provider]  aspirin EC 325 MG tablet Take 325 mg by mouth every 6 (six) hours as needed for mild pain.    [provider]  atorvastatin (LIPITOR) 40 MG tablet Take 40 mg by mouth daily.    [provider]  atorvastatin (LIPITOR) 80 MG tablet Take 80 mg by mouth daily. 06/21/22   [provider]  CAFFEINE PO Take 1 tablet by mouth as needed (for headache).    [provider]  losartan (COZAAR) 50 MG tablet Take 50 mg by mouth daily. 06/21/22   [provider]  pantoprazole (PROTONIX) 40 MG tablet Take 40 mg by mouth daily.    [provider]    Scheduled Meds: Continuous Infusions:  piperacillin-tazobactam (ZOSYN)  IV     PRN Meds:.  Allergies as of 07/05/2022   (No Known Allergies)    Family History  Adopted: Yes    Social History   Socioeconomic History   Marital status: Married    Spouse name: Not on file   Number of children: Not on file   Years of education: Not on file   Highest education level: Not on file  Occupational History   Not on file  Tobacco Use   Smoking status: Some Days    Packs/day: 0.50    Types: Cigarettes   Smokeless tobacco: Current  Types: Snuff  Substance and Sexual Activity   Alcohol use: Yes    Alcohol/week: 3.0 standard drinks of alcohol    Types: 3 Cans of beer per week   Drug use: No   Sexual activity: Not on file  Other Topics Concern   Not on file  Social History Narrative   Not on file   Social Determinants of Health   Financial Resource Strain: Not on file  Food Insecurity: Not on file  Transportation Needs: Not on file  Physical Activity: Not on file  Stress: Not on file  Social Connections: Not on file  Intimate Partner Violence: Not on file    Review of Systems: All negative except as stated above in HPI.  Physical Exam:Physical Exam Constitutional:       General: He is not in acute distress.    Appearance: He is normal weight.  HENT:     Head: Normocephalic and atraumatic.     Right Ear: External ear normal.     Left Ear: External ear normal.     Nose: Nose normal.     Mouth/Throat:     Mouth: Mucous membranes are moist.  Eyes:     Pupils: Pupils are equal, round, and reactive to light.  Cardiovascular:     Rate and Rhythm: Normal rate and regular rhythm.     Pulses: Normal pulses.     Heart sounds: Normal heart sounds.  Pulmonary:     Effort: Pulmonary effort is normal.     Breath sounds: Normal breath sounds.  Abdominal:     General: Abdomen is flat. Bowel sounds are normal. There is no distension.     Palpations: Abdomen is soft. There is no mass.     Tenderness: There is abdominal tenderness (generalized). There is guarding. There is no rebound.     Hernia: No hernia is present.  Musculoskeletal:        General: Normal range of motion.     Cervical back: Normal range of motion and neck supple.  Skin:    General: Skin is warm and dry.     Coloration: Skin is not pale.  Neurological:     General: No focal deficit present.     Mental Status: He is alert and oriented to person, place, and time. Mental status is at baseline.  Psychiatric:        Mood and Affect: Mood normal.        Behavior: Behavior normal.     Vital signs: Vitals:   07/05/22 1215 07/05/22 1315  BP: 116/83 115/80  Pulse: 97 93  Resp: (!) 23 16  Temp:    SpO2: 100% 95%        GI:  Lab Results: Recent Labs    07/05/22 0952  WBC 17.8*  HGB 13.9  HCT 42.3  PLT 234   BMET Recent Labs    07/05/22 0952  NA 142  K 3.7  CL 112*  CO2 18*  GLUCOSE 140*  BUN 13  CREATININE 1.15  CALCIUM 8.9   LFT Recent Labs    07/05/22 0952  PROT 6.6  ALBUMIN 3.7  AST 18  ALT 23  ALKPHOS 65  BILITOT 1.2   PT/INR No results for input(s): "LABPROT", "INR" in the last 72 hours.   Studies/Results: CT ABDOMEN PELVIS W CONTRAST  Result Date:  07/05/2022 CLINICAL DATA:  Abdominal pain. EXAM: CT ABDOMEN AND PELVIS WITH CONTRAST TECHNIQUE: Multidetector CT imaging of the abdomen and pelvis was performed  using the standard protocol following bolus administration of intravenous contrast. RADIATION DOSE REDUCTION: This exam was performed according to the departmental dose-optimization program which includes automated exposure control, adjustment of the mA and/or kV according to patient size and/or use of iterative reconstruction technique. CONTRAST:  160m OMNIPAQUE IOHEXOL 350 MG/ML SOLN COMPARISON:  CT examination dated November 21, 2014 FINDINGS: Lower chest: Bibasilar dependent atelectasis. Hepatobiliary: No focal liver abnormality is seen. No gallstones, gallbladder wall thickening, or biliary dilatation. Pancreas: Unremarkable. No pancreatic ductal dilatation or surrounding inflammatory changes. Spleen: Normal in size without focal abnormality. Adrenals/Urinary Tract: Adrenal glands are unremarkable. Kidneys are normal, without renal calculi, focal lesion, or hydronephrosis. Bladder is unremarkable. Stomach/Bowel: Stomach is within normal limits. Appendix appears normal. There is marked thickening of the cecum, ascending colon and proximal half of the transverse colon suggesting severe colitis with mild adjacent inflammatory changes. No evidence of pneumatosis. Distal transverse colon, descending and sigmoid colon are unremarkable. Vascular/Lymphatic: Mild aortic atherosclerosis. No enlarged abdominal or pelvic lymph nodes. Reproductive: Prostate is unremarkable. Other: No abdominal wall hernia or abnormality. No abdominopelvic ascites. Musculoskeletal: No acute or significant osseous findings. IMPRESSION: 1. Marked thickening of the cecum, ascending colon and proximal half of the transverse colon suggesting severe colitis with mild adjacent inflammatory changes. No evidence of pneumatosis or extraluminal free air. 2. Appendix is normal. 3. No evidence  of nephrolithiasis or hydronephrosis. Electronically Signed   By: IKeane PoliceD.O.   On: 07/05/2022 12:23    Impression: Post polypectomy cauterization syndrome Abdominal pain Colitis   Patient presenting with severe abdominal pain shortly after colonoscopy. He had 4 polyps between 3-680mremoved. He has leukocytosis on labs, CT with inflammation of the cecum, ascending colon and proximal half of the transverse colon. No evidence of bowel perforation. He has significant abdominal tenderness. Finding consistent with possible post polypectomy cauterization syndrome.   CT AP w/ contrast 9/28 1. Marked thickening of the cecum, ascending colon and proximal half of the transverse colon suggesting severe colitis with mild adjacent inflammatory changes. No evidence of pneumatosis or extraluminal free air. 2. Appendix is normal. 3. No evidence of nephrolithiasis or hydronephrosis.    Plan: Continue NPO Continue Zosyn IV  Recommend continuing IV fluids [facility] will follow.   LOS: 0 days   GaCharlott RakesPA-C 07/05/2022, 2:04 PM  Contact #  [phone]

## 2022-07-05 NOTE — Progress Notes (Signed)
Pharmacy Antibiotic Note  Dale [name] is a 56 y.o. male admitted on 07/05/2022 presenting with abdominal pain, concern for IAI.  Pharmacy has been consulted for zosyn dosing.  Plan: Zosyn 3.375g IV q 8h (extended 4h infusion) Monitor renal function, clinical progression and LOT  Height: '5\' 8"'$  (172.7 cm) Weight: 83.9 kg (185 lb) IBW/kg (Calculated) : 68.4  Temp (24hrs), Avg:99.3 F (37.4 C), Min:99.3 F (37.4 C), Max:99.3 F (37.4 C)  Recent Labs  Lab 07/05/22 0952  WBC 17.8*  CREATININE 1.15    Estimated Creatinine Clearance: 78.4 mL/min (by C-G formula based on SCr of 1.15 mg/dL).    No Known Allergies  Bertis Ruddy, PharmD Clinical Pharmacist ED Pharmacist Phone # [phone] 07/05/2022 3:15 PM

## 2022-07-05 NOTE — H&P (Signed)
History and Physical    Patient: Dale [name] JSH:[id number] DOB: [birth year] DOA: 07/05/2022 DOS: the patient was seen and examined on 07/05/2022 PCP: Johna Roles, PA  Patient coming from: Home - lives with wife, her daughter, and often his son; Donald Prose: Wife, [phone]   Chief Complaint: Abdominal pain  HPI: Dale [name] is a 56 y.o. male with medical history significant of HTN and HLD presenting with post-colonoscopy abdominal pain.  He had his routine colonoscopy yesterday morning.  He did the prep without difficulty.  He was on 3-year surveillance due to polyps, looked better this time, maybe able to wait 5 years next time.  He ate lunch and about 830pm he noticed mild intermittent periumbilical abdominal pain.  It felt "like a very intense ache, it would surge" up to 9, lasting about a minute.  It got more frequent through the night and became TTP.  He had to change position to try to get comfortable but it mostly kept him up through the night.  He had a couple of grainy, dark brown "silty" stools.  He called GI and they told him to come in.  Temp 99.9 at home, never >100.  Occasional fleeting nausea without emesis.     ER Course:  Colonoscopy yesterday.  Severe pain overnight.  CT with diffuse colitis without perf.  WBC 17.8.  Given dilaudid, Zofran, fluids.  GI will consult tomorrow, needs antibiotics.     Review of Systems: As mentioned in the history of present illness. All other systems reviewed and are negative. Past Medical History:  Diagnosis Date   Hypercholesteremia    Hypertension    Mononucleosis    Past Surgical History:  Procedure Laterality Date   HERNIA REPAIR     SHOULDER ARTHROSCOPY     Social History:  reports that he has been smoking cigarettes. He has been smoking an average of .5 packs per day. His smokeless tobacco use includes snuff. He reports current alcohol use of about 3.0 standard drinks of alcohol per week. He reports that he does not  use drugs.  No Known Allergies  Family History  Adopted: Yes    Prior to Admission medications   Medication Sig Start Date End Date Taking? Authorizing Provider  acetaminophen (TYLENOL) 325 MG tablet Take 650 mg by mouth every 6 (six) hours as needed for headache.    [provider]  amLODipine (NORVASC) 5 MG tablet Take 5 mg by mouth daily. 06/21/22   [provider]  aspirin EC 325 MG tablet Take 325 mg by mouth every 6 (six) hours as needed for mild pain.    [provider]  atorvastatin (LIPITOR) 40 MG tablet Take 40 mg by mouth daily.    [provider]  atorvastatin (LIPITOR) 80 MG tablet Take 80 mg by mouth daily. 06/21/22   [provider]  CAFFEINE PO Take 1 tablet by mouth as needed (for headache).    [provider]  losartan (COZAAR) 50 MG tablet Take 50 mg by mouth daily. 06/21/22   [provider]  pantoprazole (PROTONIX) 40 MG tablet Take 40 mg by mouth daily.    [provider]    Physical Exam: Vitals:   07/05/22 0948 07/05/22 1044 07/05/22 1215 07/05/22 1315  BP: 104/74  116/83 115/80  Pulse: (!) 109  97 93  Resp: 20  (!) 23 16  Temp: 99.3 F (37.4 C)     TempSrc: Oral     SpO2: 100%  100% 95%  Weight:  83.9 kg    Height:  '5\' 8"'$  (1.727 m)     General:  Appears calm and comfortable and is in NAD Eyes:  PERRL, EOMI, normal lids, iris ENT:  grossly normal hearing, lips & tongue, mmm Neck:  no LAD, masses or thyromegaly Cardiovascular:  RRR, no m/r/g. No LE edema.  Respiratory:   CTA bilaterally with no wheezes/rales/rhonchi.  Normal respiratory effort. Abdomen:  soft, TTP in midepigastric region without apparent peritoneal signs Skin:  no rash or induration seen on limited exam Musculoskeletal:  grossly normal tone BUE/BLE, good ROM, no bony abnormality Psychiatric:  grossly normal mood and affect, speech fluent and appropriate, AOx3 Neurologic:  CN 2-12 grossly intact, moves all extremities  in coordinated fashion   Radiological Exams on Admission: Independently reviewed - see discussion in A/P where applicable  CT ABDOMEN PELVIS W CONTRAST  Result Date: 07/05/2022 CLINICAL DATA:  Abdominal pain. EXAM: CT ABDOMEN AND PELVIS WITH CONTRAST TECHNIQUE: Multidetector CT imaging of the abdomen and pelvis was performed using the standard protocol following bolus administration of intravenous contrast. RADIATION DOSE REDUCTION: This exam was performed according to the departmental dose-optimization program which includes automated exposure control, adjustment of the mA and/or kV according to patient size and/or use of iterative reconstruction technique. CONTRAST:  182m OMNIPAQUE IOHEXOL 350 MG/ML SOLN COMPARISON:  CT examination dated November 21, 2014 FINDINGS: Lower chest: Bibasilar dependent atelectasis. Hepatobiliary: No focal liver abnormality is seen. No gallstones, gallbladder wall thickening, or biliary dilatation. Pancreas: Unremarkable. No pancreatic ductal dilatation or surrounding inflammatory changes. Spleen: Normal in size without focal abnormality. Adrenals/Urinary Tract: Adrenal glands are unremarkable. Kidneys are normal, without renal calculi, focal lesion, or hydronephrosis. Bladder is unremarkable. Stomach/Bowel: Stomach is within normal limits. Appendix appears normal. There is marked thickening of the cecum, ascending colon and proximal half of the transverse colon suggesting severe colitis with mild adjacent inflammatory changes. No evidence of pneumatosis. Distal transverse colon, descending and sigmoid colon are unremarkable. Vascular/Lymphatic: Mild aortic atherosclerosis. No enlarged abdominal or pelvic lymph nodes. Reproductive: Prostate is unremarkable. Other: No abdominal wall hernia or abnormality. No abdominopelvic ascites. Musculoskeletal: No acute or significant osseous findings. IMPRESSION: 1. Marked thickening of the cecum, ascending colon and proximal half of the  transverse colon suggesting severe colitis with mild adjacent inflammatory changes. No evidence of pneumatosis or extraluminal free air. 2. Appendix is normal. 3. No evidence of nephrolithiasis or hydronephrosis. Electronically Signed   By: IKeane PoliceD.O.   On: 07/05/2022 12:23    EKG: Independently reviewed.  Sinus tachycardia with rate 100; LAFB with no evidence of acute ischemia   Labs on Admission: I have personally reviewed the available labs and imaging studies at the time of the admission.  Pertinent labs:    Glucose 112 CO2 18 Glucose 140 WBC 17.8 UA WNL   Assessment and Plan: Principal Problem:   Colitis, acute Active Problems:   S/P colonoscopy   Hypercholesteremia   Hypertension    Severe colitis s/p colonoscopy -Patient reports an uneventful colonoscopy yesterday with development of severe abdominal pain overnight -Imaging c/w severe colitis -His only current SIRS criteria is leukocytosis (17.8) -Normal lactate, no current concern for sepsis -For now, will give bowel rest, IVF, pain medication with morphine, nausea medication with Zofran, and treat with Zosyn for intraabdominal infection -Will admit to med surg -GI is consulting  HTN -Continue amlodipine, losartan  HLD -Continue atorvastatin    Advance Care Planning:   Code Status: Full  Code   Consults: GI  DVT Prophylaxis: SCDs  Family Communication: None present; he is capable of communicating with family at this time  Severity of Illness: The appropriate patient status for this patient is INPATIENT. Inpatient status is judged to be reasonable and necessary in order to provide the required intensity of service to ensure the patient's safety. The patient's presenting symptoms, physical exam findings, and initial radiographic and laboratory data in the context of their chronic comorbidities is felt to place them at high risk for further clinical deterioration. Furthermore, it is not anticipated that  the patient will be medically stable for discharge from the hospital within 2 midnights of admission.   * I certify that at the point of admission it is my clinical judgment that the patient will require inpatient hospital care spanning beyond 2 midnights from the point of admission due to high intensity of service, high risk for further deterioration and high frequency of surveillance required.*  Author: Karmen Bongo, MD 07/05/2022 3:20 PM  For on call review [email or web address].

## 2022-07-05 NOTE — ED Triage Notes (Signed)
Pt. Stated , I had a colonoscopy yesterday and since 800 last night and it continues to have pain. Sometimes I'm nausea.

## 2022-07-05 NOTE — ED Provider Notes (Signed)
[facility] EMERGENCY DEPARTMENT Provider Note   CSN: [id number] Arrival date & time: 07/05/22  0931     History  Chief Complaint  Patient presents with   Abdominal Pain   Nausea    Dale [name] is a 56 y.o. male.   Abdominal Pain    Patient with medical history of hypertension, hypercholesterolemia and GERD presents today due to upper abdominal pain.  Started acutely at 8 PM last night, patient had a colonoscopy performed in the morning.  He pain is constant but the severity waxes and wanes.  It does not radiate elsewhere.  It feels like something is grabbing his upper abdomen and squeezing.  Associated with nausea but no vomiting, denies any dark and tarry stools.  He is passing gas.  History of right renal hernia repair, no other abdominal surgeries.  Not on blood thinners, he did not take his blood pressure medicine or cholesterol medicine earlier today.  Home Medications Prior to Admission medications   Medication Sig Start Date End Date Taking? Authorizing Provider  acetaminophen (TYLENOL) 325 MG tablet Take 650 mg by mouth every 6 (six) hours as needed for headache.    [provider]  amLODipine (NORVASC) 5 MG tablet Take 5 mg by mouth daily. 06/21/22   [provider]  aspirin EC 325 MG tablet Take 325 mg by mouth every 6 (six) hours as needed for mild pain.    [provider]  atorvastatin (LIPITOR) 40 MG tablet Take 40 mg by mouth daily.    [provider]  atorvastatin (LIPITOR) 80 MG tablet Take 80 mg by mouth daily. 06/21/22   [provider]  CAFFEINE PO Take 1 tablet by mouth as needed (for headache).    [provider]  losartan (COZAAR) 50 MG tablet Take 50 mg by mouth daily. 06/21/22   [provider]  pantoprazole (PROTONIX) 40 MG tablet Take 40 mg by mouth daily.    [provider]      Allergies    Patient has no known allergies.    Review of Systems   Review of  Systems  Gastrointestinal:  Positive for abdominal pain.    Physical Exam Updated Vital Signs BP 115/80   Pulse 93   Temp 99.3 F (37.4 C) (Oral)   Resp 16   Ht '5\' 8"'$  (1.727 m)   Wt 83.9 kg   SpO2 95%   BMI 28.13 kg/m  Physical Exam Vitals and nursing note reviewed. Exam conducted with a chaperone present.  Constitutional:      Appearance: Normal appearance. He is ill-appearing.     Comments: Unable to sit still, uncomfortable appearing  HENT:     Head: Normocephalic and atraumatic.  Eyes:     General: No scleral icterus.       Right eye: No discharge.        Left eye: No discharge.     Extraocular Movements: Extraocular movements intact.     Pupils: Pupils are equal, round, and reactive to light.  Cardiovascular:     Rate and Rhythm: Regular rhythm. Tachycardia present.     Pulses: Normal pulses.     Heart sounds: Normal heart sounds. No murmur heard.    No friction rub. No gallop.     Comments: Mildly tachycardic but regular rhythm.  Upper and lower extremity pulses are symmetric bilaterally Pulmonary:     Effort: Pulmonary effort is normal. No respiratory distress.     Breath sounds:  Normal breath sounds.  Abdominal:     General: Abdomen is flat. Bowel sounds are normal. There is no distension.     Palpations: Abdomen is soft.     Tenderness: There is abdominal tenderness in the epigastric area. There is guarding.     Comments: Epigastric tenderness with guarding  Skin:    General: Skin is warm and dry.     Coloration: Skin is not jaundiced.  Neurological:     Mental Status: He is alert. Mental status is at baseline.     Coordination: Coordination normal.     ED Results / Procedures / Treatments   Labs (all labs ordered are listed, but only abnormal results are displayed) Labs Reviewed  CBC WITH DIFFERENTIAL/PLATELET - Abnormal; Notable for the following components:      Result Value   WBC 17.8 (*)    Neutro Abs 16.3 (*)    Abs Immature Granulocytes 0.09  (*)    All other components within normal limits  COMPREHENSIVE METABOLIC PANEL - Abnormal; Notable for the following components:   Chloride 112 (*)    CO2 18 (*)    Glucose, Bld 140 (*)    All other components within normal limits  LIPASE, BLOOD  URINALYSIS, ROUTINE W REFLEX MICROSCOPIC    EKG None  Radiology CT ABDOMEN PELVIS W CONTRAST  Result Date: 07/05/2022 CLINICAL DATA:  Abdominal pain. EXAM: CT ABDOMEN AND PELVIS WITH CONTRAST TECHNIQUE: Multidetector CT imaging of the abdomen and pelvis was performed using the standard protocol following bolus administration of intravenous contrast. RADIATION DOSE REDUCTION: This exam was performed according to the departmental dose-optimization program which includes automated exposure control, adjustment of the mA and/or kV according to patient size and/or use of iterative reconstruction technique. CONTRAST:  155m OMNIPAQUE IOHEXOL 350 MG/ML SOLN COMPARISON:  CT examination dated November 21, 2014 FINDINGS: Lower chest: Bibasilar dependent atelectasis. Hepatobiliary: No focal liver abnormality is seen. No gallstones, gallbladder wall thickening, or biliary dilatation. Pancreas: Unremarkable. No pancreatic ductal dilatation or surrounding inflammatory changes. Spleen: Normal in size without focal abnormality. Adrenals/Urinary Tract: Adrenal glands are unremarkable. Kidneys are normal, without renal calculi, focal lesion, or hydronephrosis. Bladder is unremarkable. Stomach/Bowel: Stomach is within normal limits. Appendix appears normal. There is marked thickening of the cecum, ascending colon and proximal half of the transverse colon suggesting severe colitis with mild adjacent inflammatory changes. No evidence of pneumatosis. Distal transverse colon, descending and sigmoid colon are unremarkable. Vascular/Lymphatic: Mild aortic atherosclerosis. No enlarged abdominal or pelvic lymph nodes. Reproductive: Prostate is unremarkable. Other: No abdominal wall  hernia or abnormality. No abdominopelvic ascites. Musculoskeletal: No acute or significant osseous findings. IMPRESSION: 1. Marked thickening of the cecum, ascending colon and proximal half of the transverse colon suggesting severe colitis with mild adjacent inflammatory changes. No evidence of pneumatosis or extraluminal free air. 2. Appendix is normal. 3. No evidence of nephrolithiasis or hydronephrosis. Electronically Signed   By: IKeane PoliceD.O.   On: 07/05/2022 12:23    Procedures Procedures    Medications Ordered in ED Medications  piperacillin-tazobactam (ZOSYN) IVPB 3.375 g (has no administration in time range)  ondansetron (ZOFRAN) injection 4 mg (4 mg Intravenous Given 07/05/22 1131)  lactated ringers bolus 1,000 mL (1,000 mLs Intravenous New Bag/Given 07/05/22 1217)  HYDROmorphone (DILAUDID) injection 1 mg (1 mg Intravenous Given 07/05/22 1211)  iohexol (OMNIPAQUE) 350 MG/ML injection 100 mL (100 mLs Intravenous Contrast Given 07/05/22 1209)    ED Course/ Medical Decision Making/ A&P Clinical Course as of  07/05/22 1409  Thu Jul 05, 2022  1127 I called CT letting them know this patient is to be prioritized for CT of the abdomen and pelvis due to concern for perforation [HS]  1137 I called CT, they are aware patient has IV, they will bring him back next. [HS]  6384 I consulted with Dr. Cari Caraway on-call with [facility] gastroenterology.  He reviewed the imaging, advises Zosyn for colitis and admission to hospitalist service with GI consult in the morning. [HS]  1346 Patient's pain is improved but not fully resolved, still in the epigastric tenderness on exam.  Discussed findings of the CT scan as well as recommendations from gastroenterology, patient is agreeable for admission. [HS]    Clinical Course User Index [HS] Sherrill Raring, PA-C                           Medical Decision Making Risk Prescription drug management. Decision regarding hospitalization.   Patient presents due to  upper abdominal pain x1 day.  Differential includes not limited to abdominal perforation, SBO, pancreatitis, cholecystitis, sepsis, AAA, atypical ACS, dissection.  Patient is clearly uncomfortable on my exam.  Epigastric TTP and is with guarding, mildly tachycardic with regular rhythm but blood pressure is actually soft at 104/74 despite not taking his hypertensive today.  Also structures are extremities roughly symmetric, lungs clear to auscultation and upper and lower extremity pulses are symmetric and 2+. -BP 104/74 (BP Location: Right Arm)   Pulse (!) 109   Temp 99.3 F (37.4 C) (Oral)   Resp 20   Ht '5\' 8"'$  (1.727 m)   Wt 83.9 kg   SpO2 100%   BMI 28.13 kg/m   Patient's wife is an independent historian.  Additional history is also obtained from Dr. Michail Sermon, on call gastroenterologist for [facility], who called ahead concerned about possible perforation after colonoscopy yesterday requesting stat CT abdomen pelvis with contrast.  I ordered, viewed and interpreted laboratory work-up. -CBC with leukocytosis 66.5 with neutrophilic predominance 99.3.  Normal platelet counts, not anemic. -CMP without gross electrolyte derangement or AKI.  Bicarb is decreased at 18, no transaminitis. -Lipase is within normal limits, not consistent with a pancreatitis. -UA is negative for UTI.  Patient was on cardiac monitoring, sinus tachycardia per my interpretation of the monitor.  EKG shows sinus tachycardia without acute specific ischemic findings.  I ordered, viewed and interpreted imaging studies.  CT abdomen pelvis with contrast -shows severe colitis but no pneumonitis or perforation.  I viewed patient's home medication list including his antihypertensives and cholesterol medicine.  I ordered fluid bolus, Zofran and Dilaudid for pain.  Patient given morphine in triage.  I ordered Zosyn.  I consulted with gastroenterology as documented ED course.  Advised admission, IV antibiotics and GI to consult.   Appreciate their consult.  I did ask about lactic and cultures but given patient is not hypotensive does not think lactic indicated.   I will consult hospital service for admission.  Spoke with Dr. Lorin Mercy with hospitalist service who agrees with admission.        Final Clinical Impression(s) / ED Diagnoses Final diagnoses:  Colitis    Rx / DC Orders ED Discharge Orders     None         Sherrill Raring, PA-C 07/05/22 1409    Blanchie Dessert, MD 07/07/22 2023

## 2022-07-05 NOTE — ED Provider Notes (Signed)
I spoke with Dr. Michail Sermon his gastroenterologist.  He stated the patient had a colonoscopy yesterday and is having abdominal discomfort.  He felt like the patient needed to come to the emergency department and get a CT scan of his abdomen soon as possible to check for perforation.  The CT scan has been ordered   Milton Ferguson, MD 07/05/22 [phone]

## 2022-07-06 DIAGNOSIS — K529 Noninfective gastroenteritis and colitis, unspecified: Secondary | ICD-10-CM | POA: Diagnosis not present

## 2022-07-06 LAB — CBC
HCT: 35.2 % — ABNORMAL LOW (ref 39.0–52.0)
Hemoglobin: 11.6 g/dL — ABNORMAL LOW (ref 13.0–17.0)
MCH: 32.1 pg (ref 26.0–34.0)
MCHC: 33 g/dL (ref 30.0–36.0)
MCV: 97.5 fL (ref 80.0–100.0)
Platelets: 174 10*3/uL (ref 150–400)
RBC: 3.61 MIL/uL — ABNORMAL LOW (ref 4.22–5.81)
RDW: 12.5 % (ref 11.5–15.5)
WBC: 14.4 10*3/uL — ABNORMAL HIGH (ref 4.0–10.5)
nRBC: 0 % (ref 0.0–0.2)

## 2022-07-06 LAB — COMPREHENSIVE METABOLIC PANEL
ALT: 17 U/L (ref 0–44)
AST: 15 U/L (ref 15–41)
Albumin: 2.9 g/dL — ABNORMAL LOW (ref 3.5–5.0)
Alkaline Phosphatase: 49 U/L (ref 38–126)
Anion gap: 11 (ref 5–15)
BUN: 8 mg/dL (ref 6–20)
CO2: 23 mmol/L (ref 22–32)
Calcium: 8.4 mg/dL — ABNORMAL LOW (ref 8.9–10.3)
Chloride: 103 mmol/L (ref 98–111)
Creatinine, Ser: 1.25 mg/dL — ABNORMAL HIGH (ref 0.61–1.24)
GFR, Estimated: >60 mL/min (ref >60)
Glucose, Bld: 104 mg/dL — ABNORMAL HIGH (ref 70–99)
Potassium: 3.2 mmol/L — ABNORMAL LOW (ref 3.5–5.1)
Sodium: 137 mmol/L (ref 135–145)
Total Bilirubin: 1.6 mg/dL — ABNORMAL HIGH (ref 0.3–1.2)
Total Protein: 5.6 g/dL — ABNORMAL LOW (ref 6.5–8.1)

## 2022-07-06 LAB — LACTIC ACID, PLASMA: Lactic Acid, Venous: 1.5 mmol/L (ref 0.5–1.9)

## 2022-07-06 NOTE — Progress Notes (Signed)
[facility] Gastroenterology Progress Note  Dale [name] 56 y.o. July 06, 1966   Subjective: Abdominal pain has improved but worse with movement. Pain is 2/10 when resting in bed. Reports 2 brown stools today. Denies rectal bleeding. Wife in room.  Objective: Vital signs: Vitals:   07/06/22 0805 07/06/22 0900  BP: 109/76 116/79  Pulse:  95  Resp:  18  Temp:  98.2 F (36.8 C)  SpO2:  97%    Physical Exam: Gen: lethargic, well-nourished, no acute distress  HEENT: anicteric sclera CV: RRR Chest: CTA B Abd: severe diffuse tenderness to light palpation with guarding, soft, nondistended, +BS Ext: no edema  Lab Results: Recent Labs    07/05/22 0952 07/06/22 0400  NA 142 137  K 3.7 3.2*  CL 112* 103  CO2 18* 23  GLUCOSE 140* 104*  BUN 13 8  CREATININE 1.15 1.25*  CALCIUM 8.9 8.4*   Recent Labs    07/05/22 0952 07/06/22 0400  AST 18 15  ALT 23 17  ALKPHOS 65 49  BILITOT 1.2 1.6*  PROT 6.6 5.6*  ALBUMIN 3.7 2.9*   Recent Labs    07/05/22 0952 07/06/22 0400  WBC 17.8* 14.4*  NEUTROABS 16.3*  --   HGB 13.9 11.6*  HCT 42.3 35.2*  MCV 96.1 97.5  PLT 234 174      Assessment/Plan: Proximal colon inflammation following a colonoscopy without imaging sign of perforation. Normal Lactic acid. No snare polypectomy done during colonoscopy to suggest cauterization syndrome. Had 4 small polyps removed with cold biopsy forceps. Presentation very unusual post-colonoscopy. Appreciate surgery team's recs. Continue bowel rest, IV Abx, IVFs. Dr. Randel [name] from [facility] will see her tomorrow.   Dale [name] 07/06/2022, 3:56 PM  Questions please call [phone] Patient ID: Dale [name], male   DOB: Jul 22, 1966, 56 y.o.   MRN: [id number]

## 2022-07-06 NOTE — Progress Notes (Signed)
Subjective: WBC downtrending to 14. Afebrile this morning. Continues to have significant abdominal pain but feels it has improved since admission.   Objective: Vital signs in last 24 hours: Temp:  [98.2 F (36.8 C)-100.5 F (38.1 C)] 98.2 F (36.8 C) (09/29 0900) Pulse Rate:  [81-103] 95 (09/29 0900) Resp:  [16-23] 18 (09/29 0900) BP: (84-124)/(57-112) 116/79 (09/29 0900) SpO2:  [93 %-100 %] 97 % (09/29 0900) Weight:  [81.6 kg] 81.6 kg (09/29 0852)    Intake/Output from previous day: 09/28 0701 - 09/29 0700 In: 594 [I.V.:286.5; IV Piggyback:307.5] Out: -  Intake/Output this shift: No intake/output data recorded.  PE: General: resting comfortably, NAD Neuro: alert and oriented, no focal deficits Resp: normal work of breathing on room air Abdomen: soft, nondistended, diffusely tender to palpation. Extremities: warm and well-perfused   Lab Results:  Recent Labs    07/05/22 0952 07/06/22 0400  WBC 17.8* 14.4*  HGB 13.9 11.6*  HCT 42.3 35.2*  PLT 234 174   BMET Recent Labs    07/05/22 0952 07/06/22 0400  NA 142 137  K 3.7 3.2*  CL 112* 103  CO2 18* 23  GLUCOSE 140* 104*  BUN 13 8  CREATININE 1.15 1.25*  CALCIUM 8.9 8.4*   PT/INR No results for input(s): "LABPROT", "INR" in the last 72 hours. CMP     Component Value Date/Time   NA 137 07/06/2022 0400   K 3.2 (L) 07/06/2022 0400   CL 103 07/06/2022 0400   CO2 23 07/06/2022 0400   GLUCOSE 104 (H) 07/06/2022 0400   BUN 8 07/06/2022 0400   CREATININE 1.25 (H) 07/06/2022 0400   CALCIUM 8.4 (L) 07/06/2022 0400   PROT 5.6 (L) 07/06/2022 0400   ALBUMIN 2.9 (L) 07/06/2022 0400   AST 15 07/06/2022 0400   ALT 17 07/06/2022 0400   ALKPHOS 49 07/06/2022 0400   BILITOT 1.6 (H) 07/06/2022 0400   GFRNONAA >60 07/06/2022 0400   GFRAA >60 11/14/2019 1323   Lipase     Component Value Date/Time   LIPASE 24 07/05/2022 0952       Studies/Results: CT ABDOMEN PELVIS W CONTRAST  Result Date:  07/05/2022 CLINICAL DATA:  Abdominal pain. EXAM: CT ABDOMEN AND PELVIS WITH CONTRAST TECHNIQUE: Multidetector CT imaging of the abdomen and pelvis was performed using the standard protocol following bolus administration of intravenous contrast. RADIATION DOSE REDUCTION: This exam was performed according to the departmental dose-optimization program which includes automated exposure control, adjustment of the mA and/or kV according to patient size and/or use of iterative reconstruction technique. CONTRAST:  179m OMNIPAQUE IOHEXOL 350 MG/ML SOLN COMPARISON:  CT examination dated November 21, 2014 FINDINGS: Lower chest: Bibasilar dependent atelectasis. Hepatobiliary: No focal liver abnormality is seen. No gallstones, gallbladder wall thickening, or biliary dilatation. Pancreas: Unremarkable. No pancreatic ductal dilatation or surrounding inflammatory changes. Spleen: Normal in size without focal abnormality. Adrenals/Urinary Tract: Adrenal glands are unremarkable. Kidneys are normal, without renal calculi, focal lesion, or hydronephrosis. Bladder is unremarkable. Stomach/Bowel: Stomach is within normal limits. Appendix appears normal. There is marked thickening of the cecum, ascending colon and proximal half of the transverse colon suggesting severe colitis with mild adjacent inflammatory changes. No evidence of pneumatosis. Distal transverse colon, descending and sigmoid colon are unremarkable. Vascular/Lymphatic: Mild aortic atherosclerosis. No enlarged abdominal or pelvic lymph nodes. Reproductive: Prostate is unremarkable. Other: No abdominal wall hernia or abnormality. No abdominopelvic ascites. Musculoskeletal: No acute or significant osseous findings. IMPRESSION: 1. Marked thickening of the  cecum, ascending colon and proximal half of the transverse colon suggesting severe colitis with mild adjacent inflammatory changes. No evidence of pneumatosis or extraluminal free air. 2. Appendix is normal. 3. No evidence  of nephrolithiasis or hydronephrosis. Electronically Signed   By: Keane Police D.O.   On: 07/05/2022 12:23    Anti-infectives: Anti-infectives (From admission, onward)    Start     Dose/Rate Route Frequency Ordered Stop   07/05/22 2200  piperacillin-tazobactam (ZOSYN) IVPB 3.375 g        3.375 g 12.5 mL/hr over 240 Minutes Intravenous Every 8 hours 07/05/22 1515     07/05/22 1345  piperacillin-tazobactam (ZOSYN) IVPB 3.375 g        3.375 g 12.5 mL/hr over 240 Minutes Intravenous  Once 07/05/22 1340 07/05/22 1950        Assessment/Plan 56 yo male presenting with colitis of the right colon. He continues to have pain but it is slowly improving, and leukocytosis is improving. No indication for surgical intervention at this time, however we will continue to follow. Continue bowel rest, IV fluid hydration, and IV antibiotics. We will continue to follow.    LOS: 1 day    Michaelle Birks, MD [facility] Surgery General, Hepatobiliary and Pancreatic Surgery 07/06/22 11:12 AM

## 2022-07-06 NOTE — Progress Notes (Signed)
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 on CT head when he was seen in the ED for migraine headache from 06/2019, developed ab pain with nausea on 9/28 evening after routine colonoscopy on 9/28 am, CT ab/pel showed colitis  Subjective:  Reports ab pain is better, he is npo, on ivf and iv abx Denies blood in stool Wife at bedside  Assessment & Plan:  Principal Problem:   Colitis, acute Active Problems:   S/P colonoscopy   Hypercholesteremia   Hypertension   Colitis post colonoscopy Unclear etiology Seen by gen surg and GI, continue conservative management, wbc decreasing   Hypokalemia Replace k, check mag  CKDII Cr appear close to baseline  Renal dosing meds  HTN/HLD Stable, continue home meds    I have Reviewed nursing notes, Vitals, pain scores, I/o's, Lab results and  imaging results since pt's last encounter, details please see discussion above  I ordered the following labs:  Unresulted Labs (From admission, onward)     Start     Ordered   07/07/22 0500  CBC with Differential/Platelet  Daily at 5am,   R      07/06/22 1632   07/07/22 5277  Basic metabolic panel  Daily at 5am,   R      07/06/22 1632   07/07/22 0500  Magnesium  Tomorrow morning,   R        07/06/22 1632   07/07/22 0500  Hepatic function panel  Tomorrow morning,   R        07/06/22 1632   07/05/22 1511  HIV Antibody (routine testing w rflx)  (HIV Antibody (Routine testing w reflex) panel)  Once,   R        07/05/22 1511             DVT prophylaxis: SCDs Start: 07/05/22 1511   Code Status:   Code Status: Full Code  Family Communication: wife at bedside  Disposition:    Dispo: The patient is from: home              Anticipated d/c is to: home              Anticipated d/c date is: currently npo, not ready to discharge, home once   able to tolerate oral intake and cleared by GI  Antimicrobials:    Anti-infectives (From admission, onward)    Start     Dose/Rate Route Frequency Ordered Stop   07/05/22 2200  piperacillin-tazobactam (ZOSYN) IVPB 3.375 g        3.375 g 12.5 mL/hr over 240 Minutes Intravenous Every 8 hours 07/05/22 1515     07/05/22 1345  piperacillin-tazobactam (ZOSYN) IVPB 3.375 g        3.375 g 12.5 mL/hr over 240 Minutes Intravenous  Once 07/05/22 1340 07/05/22 1950          Objective: Vitals:   07/06/22 0627 07/06/22 0805 07/06/22 0852 07/06/22 0900  BP:  109/76  116/79  Pulse:    95  Resp:    18  Temp: 99.3 F (37.4 C)   98.2 F (36.8 C)  TempSrc: Oral   Oral  SpO2:    97%  Weight:   81.6 kg   Height:   '5\' 8"'$  (1.727 m)     Intake/Output Summary (Last 24 hours) at 07/06/2022 1926  Last data filed at 07/06/2022 1809 Gross per 24 hour  Intake 1436.58 ml  Output --  Net 1436.58 ml   Filed Weights   07/05/22 1044 07/06/22 0852  Weight: 83.9 kg 81.6 kg    Examination:  General exam: alert, awake, communicative,calm, NAD Respiratory system: Clear to auscultation. Respiratory effort normal. Cardiovascular system:  RRR.  Gastrointestinal system: diffuse tender to light touch.  Normal bowel sounds heard. Central nervous system: Alert and oriented. No focal neurological deficits. Extremities:  no edema Skin: No rashes, lesions or ulcers Psychiatry: Judgement and insight appear normal. Mood & affect appropriate.     Data Reviewed: I have personally reviewed  labs and visualized  imaging studies since the last encounter and formulate the plan        Scheduled Meds:  amLODipine  5 mg Oral Daily   atorvastatin  80 mg Oral Daily   losartan  50 mg Oral Daily   pantoprazole  40 mg Oral Daily   Continuous Infusions:  lactated ringers 75 mL/hr at 07/06/22 1816   piperacillin-tazobactam (ZOSYN)  IV 3.375 g (07/06/22 1422)     LOS: 1 day     Florencia Reasons, MD PhD FACP [facility]  Available via Epic secure chat 7am-7pm for nonurgent issues Please page for urgent issues To page the attending provider between 7A-7P or the covering provider during after hours 7P-7A, please log into the web site [email or web address] and access using universal [place] password for that web site. If you do not have the password, please call the hospital operator.    07/06/2022, 7:26 PM

## 2022-07-07 DIAGNOSIS — K529 Noninfective gastroenteritis and colitis, unspecified: Secondary | ICD-10-CM | POA: Diagnosis not present

## 2022-07-07 LAB — BASIC METABOLIC PANEL
Anion gap: 9 (ref 5–15)
BUN: 7 mg/dL (ref 6–20)
CO2: 22 mmol/L (ref 22–32)
Calcium: 8.3 mg/dL — ABNORMAL LOW (ref 8.9–10.3)
Chloride: 108 mmol/L (ref 98–111)
Creatinine, Ser: 1.1 mg/dL (ref 0.61–1.24)
GFR, Estimated: >60 mL/min (ref >60)
Glucose, Bld: 79 mg/dL (ref 70–99)
Potassium: 3.2 mmol/L — ABNORMAL LOW (ref 3.5–5.1)
Sodium: 139 mmol/L (ref 135–145)

## 2022-07-07 LAB — HEPATIC FUNCTION PANEL
ALT: 17 U/L (ref 0–44)
AST: 14 U/L — ABNORMAL LOW (ref 15–41)
Albumin: 2.7 g/dL — ABNORMAL LOW (ref 3.5–5.0)
Alkaline Phosphatase: 43 U/L (ref 38–126)
Bilirubin, Direct: 0.2 mg/dL (ref 0.0–0.2)
Indirect Bilirubin: 0.8 mg/dL (ref 0.3–0.9)
Total Bilirubin: 1 mg/dL (ref 0.3–1.2)
Total Protein: 5.9 g/dL — ABNORMAL LOW (ref 6.5–8.1)

## 2022-07-07 LAB — CBC WITH DIFFERENTIAL/PLATELET
Abs Immature Granulocytes: 0.03 10*3/uL (ref 0.00–0.07)
Basophils Absolute: 0 10*3/uL (ref 0.0–0.1)
Basophils Relative: 0 %
Eosinophils Absolute: 0.3 10*3/uL (ref 0.0–0.5)
Eosinophils Relative: 3 %
HCT: 34.4 % — ABNORMAL LOW (ref 39.0–52.0)
Hemoglobin: 11.7 g/dL — ABNORMAL LOW (ref 13.0–17.0)
Immature Granulocytes: 0 %
Lymphocytes Relative: 14 %
Lymphs Abs: 1.1 10*3/uL (ref 0.7–4.0)
MCH: 31.9 pg (ref 26.0–34.0)
MCHC: 34 g/dL (ref 30.0–36.0)
MCV: 93.7 fL (ref 80.0–100.0)
Monocytes Absolute: 0.4 10*3/uL (ref 0.1–1.0)
Monocytes Relative: 5 %
Neutro Abs: 6.2 10*3/uL (ref 1.7–7.7)
Neutrophils Relative %: 78 %
Platelets: 176 10*3/uL (ref 150–400)
RBC: 3.67 MIL/uL — ABNORMAL LOW (ref 4.22–5.81)
RDW: 12.2 % (ref 11.5–15.5)
WBC: 8.1 10*3/uL (ref 4.0–10.5)
nRBC: 0 % (ref 0.0–0.2)

## 2022-07-07 LAB — MAGNESIUM: Magnesium: 1.8 mg/dL (ref 1.7–2.4)

## 2022-07-07 LAB — HIV ANTIBODY (ROUTINE TESTING W REFLEX): HIV Screen 4th Generation wRfx: NONREACTIVE

## 2022-07-07 MED ORDER — POTASSIUM CHLORIDE CRYS ER 20 MEQ PO TBCR
40.0000 meq | EXTENDED_RELEASE_TABLET | Freq: Once | ORAL | Status: AC
  Administered 2022-07-07: 40 meq via ORAL
  Filled 2022-07-07: qty 2

## 2022-07-07 MED ORDER — LACTATED RINGERS IV SOLN: INTRAVENOUS | Status: AC

## 2022-07-07 NOTE — Progress Notes (Signed)
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 on CT head when he was seen in the ED for migraine headache from 06/2019, developed ab pain with nausea on 9/28 evening after routine colonoscopy on 9/28 am, CT ab/pel showed colitis  Subjective:  Reports ab pain is better, down to 1, no n/v, tolerating  ivf and iv abx Denies blood in stool   Assessment & Plan:  Principal Problem:   Colitis, acute Active Problems:   S/P colonoscopy   Hypercholesteremia   Hypertension   Colitis post colonoscopy Unclear etiology Seen by gen surg and GI, continue conservative management, wbc normalized  Hypokalemia Remain low, continue to replace mag 1.8 Repeat labs in the morning  CKDII Cr appear close to baseline  Renal dosing meds  HTN/HLD Stable, continue home meds    I have Reviewed nursing notes, Vitals, pain scores, I/o's, Lab results and  imaging results since pt's last encounter, details please see discussion above  I ordered the following labs:  Unresulted Labs (From admission, onward)     Start     Ordered   07/07/22 0500  CBC with Differential/Platelet  Daily at 5am,   R      07/06/22 1632   07/07/22 8119  Basic metabolic panel  Daily at 5am,   R      07/06/22 1632   07/07/22 0500  Magnesium  Tomorrow morning,   R        07/06/22 1632   07/07/22 0500  Hepatic function panel  Tomorrow morning,   R        07/06/22 1632             DVT prophylaxis: SCDs Start: 07/05/22 1511   Code Status:   Code Status: Full Code  Family Communication: wife at bedside on 9/29 Disposition:    Dispo: The patient is from: home              Anticipated d/c is to: home              Anticipated d/c date is: home once  able to tolerate oral intake and cleared by GI  Antimicrobials:    Anti-infectives (From admission, onward)     Start     Dose/Rate Route Frequency Ordered Stop   07/05/22 2200  piperacillin-tazobactam (ZOSYN) IVPB 3.375 g        3.375 g 12.5 mL/hr over 240 Minutes Intravenous Every 8 hours 07/05/22 1515     07/05/22 1345  piperacillin-tazobactam (ZOSYN) IVPB 3.375 g        3.375 g 12.5 mL/hr over 240 Minutes Intravenous  Once 07/05/22 1340 07/05/22 1950          Objective: Vitals:   07/06/22 0900 07/06/22 2021 07/06/22 2347 07/07/22 0417  BP: 116/79 110/80 (!) 131/96 123/80  Pulse: 95 88 98 90  Resp: '18 18 18 18  '$ Temp: 98.2 F (36.8 C) 98.9 F (37.2 C) 98.4 F (36.9 C) 98.3 F (36.8 C)  TempSrc: Oral Oral  Oral  SpO2: 97% 96% 97% 96%  Weight:      Height:        Intake/Output Summary (Last 24 hours) at 07/07/2022 0730 Last data filed at 07/07/2022 0400 Gross per 24 hour  Intake 852.85 ml  Output --  Net 852.85 ml   [employer]  07/05/22 1044 07/06/22 0852  Weight: 83.9 kg 81.6 kg    Examination:  General exam: alert, awake, communicative,calm, NAD Respiratory system: Clear to auscultation. Respiratory effort normal. Cardiovascular system:  RRR.  Gastrointestinal system: ab pain improved,   Normal bowel sounds Central nervous system: Alert and oriented. No focal neurological deficits. Extremities:  no edema Skin: No rashes, lesions or ulcers Psychiatry: Judgement and insight appear normal. Mood & affect appropriate.     Data Reviewed: I have personally reviewed  labs and visualized  imaging studies since the last encounter and formulate the plan        Scheduled Meds:  amLODipine  5 mg Oral Daily   atorvastatin  80 mg Oral Daily   losartan  50 mg Oral Daily   pantoprazole  40 mg Oral Daily   Continuous Infusions:  lactated ringers Stopped (07/06/22 1822)   piperacillin-tazobactam (ZOSYN)  IV 3.375 g (07/07/22 0448)     LOS: 2 days     Florencia Reasons, MD PhD FACP [facility]  Available via Epic secure chat 7am-7pm for nonurgent issues Please  page for urgent issues To page the attending provider between 7A-7P or the covering provider during after hours 7P-7A, please log into the web site [email or web address] and access using universal [place] password for that web site. If you do not have the password, please call the hospital operator.    07/07/2022, 7:30 AM

## 2022-07-07 NOTE — Progress Notes (Signed)
Subjective: Patient feels much better today, pain nearly resolved. WBC normal.   Objective: Vital signs in last 24 hours: Temp:  [98.3 F (36.8 C)-98.9 F (37.2 C)] 98.9 F (37.2 C) (09/30 0726) Pulse Rate:  [79-98] 79 (09/30 0726) Resp:  [18] 18 (09/30 0726) BP: (110-134)/(80-96) 134/86 (09/30 0726) SpO2:  [96 %-97 %] 96 % (09/30 0726)    Intake/Output from previous day: 09/29 0701 - 09/30 0700 In: 852.9 [I.V.:852.9] Out: -  Intake/Output this shift: No intake/output data recorded.  PE: General: resting comfortably, NAD Neuro: alert and oriented, no focal deficits Resp: normal work of breathing on room air Abdomen: soft, nondistended, very mild tenderness to palpation on right side of abdomen, much improved from prior exams. Extremities: warm and well-perfused   Lab Results:  Recent Labs    07/06/22 0400 07/07/22 0634  WBC 14.4* 8.1  HGB 11.6* 11.7*  HCT 35.2* 34.4*  PLT 174 176   BMET Recent Labs    07/06/22 0400 07/07/22 0634  NA 137 139  K 3.2* 3.2*  CL 103 108  CO2 23 22  GLUCOSE 104* 79  BUN 8 7  CREATININE 1.25* 1.10  CALCIUM 8.4* 8.3*   PT/INR No results for input(s): "LABPROT", "INR" in the last 72 hours. CMP     Component Value Date/Time   NA 139 07/07/2022 0634   K 3.2 (L) 07/07/2022 0634   CL 108 07/07/2022 0634   CO2 22 07/07/2022 0634   GLUCOSE 79 07/07/2022 0634   BUN 7 07/07/2022 0634   CREATININE 1.10 07/07/2022 0634   CALCIUM 8.3 (L) 07/07/2022 0634   PROT 5.9 (L) 07/07/2022 0634   ALBUMIN 2.7 (L) 07/07/2022 0634   AST 14 (L) 07/07/2022 0634   ALT 17 07/07/2022 0634   ALKPHOS 43 07/07/2022 0634   BILITOT 1.0 07/07/2022 0634   GFRNONAA >60 07/07/2022 0634   GFRAA >60 11/14/2019 1323   Lipase     Component Value Date/Time   LIPASE 24 07/05/2022 0952       Studies/Results: CT ABDOMEN PELVIS W CONTRAST  Result Date: 07/05/2022 CLINICAL DATA:  Abdominal pain. EXAM: CT ABDOMEN AND PELVIS WITH CONTRAST  TECHNIQUE: Multidetector CT imaging of the abdomen and pelvis was performed using the standard protocol following bolus administration of intravenous contrast. RADIATION DOSE REDUCTION: This exam was performed according to the departmental dose-optimization program which includes automated exposure control, adjustment of the mA and/or kV according to patient size and/or use of iterative reconstruction technique. CONTRAST:  158m OMNIPAQUE IOHEXOL 350 MG/ML SOLN COMPARISON:  CT examination dated November 21, 2014 FINDINGS: Lower chest: Bibasilar dependent atelectasis. Hepatobiliary: No focal liver abnormality is seen. No gallstones, gallbladder wall thickening, or biliary dilatation. Pancreas: Unremarkable. No pancreatic ductal dilatation or surrounding inflammatory changes. Spleen: Normal in size without focal abnormality. Adrenals/Urinary Tract: Adrenal glands are unremarkable. Kidneys are normal, without renal calculi, focal lesion, or hydronephrosis. Bladder is unremarkable. Stomach/Bowel: Stomach is within normal limits. Appendix appears normal. There is marked thickening of the cecum, ascending colon and proximal half of the transverse colon suggesting severe colitis with mild adjacent inflammatory changes. No evidence of pneumatosis. Distal transverse colon, descending and sigmoid colon are unremarkable. Vascular/Lymphatic: Mild aortic atherosclerosis. No enlarged abdominal or pelvic lymph nodes. Reproductive: Prostate is unremarkable. Other: No abdominal wall hernia or abnormality. No abdominopelvic ascites. Musculoskeletal: No acute or significant osseous findings. IMPRESSION: 1. Marked thickening of the cecum, ascending colon and proximal half of the transverse colon suggesting  severe colitis with mild adjacent inflammatory changes. No evidence of pneumatosis or extraluminal free air. 2. Appendix is normal. 3. No evidence of nephrolithiasis or hydronephrosis. Electronically Signed   By: Keane Police D.O.    On: 07/05/2022 12:23    Anti-infectives: Anti-infectives (From admission, onward)    Start     Dose/Rate Route Frequency Ordered Stop   07/05/22 2200  piperacillin-tazobactam (ZOSYN) IVPB 3.375 g        3.375 g 12.5 mL/hr over 240 Minutes Intravenous Every 8 hours 07/05/22 1515     07/05/22 1345  piperacillin-tazobactam (ZOSYN) IVPB 3.375 g        3.375 g 12.5 mL/hr over 240 Minutes Intravenous  Once 07/05/22 1340 07/05/22 1950        Assessment/Plan 56 yo male presenting with colitis of the right colon following a colonoscopy.  - Pain significantly improved with IV antibiotics and hydration. WBC has normalized. - Advance to clear liquid diet - Continue antibiotics    LOS: 2 days    Michaelle Birks, MD [facility] General, Hepatobiliary and Pancreatic Surgery 07/07/22 9:21 AM

## 2022-07-07 NOTE — Progress Notes (Signed)
[facility] Progress Note  SUBJECTIVE:   Interval history: Dale [name] was seen and evaluated today at bedside. He notes that he is feeling much better today, his abdominal pain is improved. He is tolerating a liquid diet with no issue. No nausea or vomiting. Denied chest pain, palpitations, shortness of breath. Has intermittent cough, history of tobacco use. Had dark brown formed bowel movement today.   Past Medical History:  Diagnosis Date   Hypercholesteremia    Hypertension    Mononucleosis    Past Surgical History:  Procedure Laterality Date   HERNIA REPAIR     SHOULDER ARTHROSCOPY     Current Facility-Administered Medications  Medication Dose Route Frequency Provider Last Rate Last Admin   acetaminophen (TYLENOL) tablet 650 mg  650 mg Oral Q6H PRN Karmen Bongo, MD   650 mg at 07/05/22 2238   Or   acetaminophen (TYLENOL) suppository 650 mg  650 mg Rectal Q6H PRN Karmen Bongo, MD       amLODipine (NORVASC) tablet 5 mg  5 mg Oral Daily Karmen Bongo, MD   5 mg at 07/07/22 0912   atorvastatin (LIPITOR) tablet 80 mg  80 mg Oral Daily Karmen Bongo, MD   80 mg at 07/07/22 1696   hydrALAZINE (APRESOLINE) injection 10 mg  10 mg Intravenous Q2H PRN Karmen Bongo, MD       lactated ringers infusion   Intravenous Continuous Florencia Reasons, MD 75 mL/hr at 07/07/22 0953 New Bag at 07/07/22 0953   losartan (COZAAR) tablet 50 mg  50 mg Oral Daily Karmen Bongo, MD   50 mg at 07/07/22 0912   morphine (PF) 2 MG/ML injection 2 mg  2 mg Intravenous Q2H PRN Karmen Bongo, MD   2 mg at 07/05/22 1952   ondansetron (ZOFRAN-ODT) disintegrating tablet 4 mg  4 mg Oral Q6H PRN Karmen Bongo, MD       Or   ondansetron [facility]) injection 4 mg  4 mg Intravenous Q6H PRN Karmen Bongo, MD   4 mg at 07/05/22 1951   oxyCODONE (Oxy IR/ROXICODONE) immediate release tablet 5-10 mg  5-10 mg Oral Q4H PRN Karmen Bongo, MD   10 mg at 07/06/22 2115   pantoprazole (PROTONIX) EC tablet 40  mg  40 mg Oral Daily Karmen Bongo, MD   40 mg at 07/07/22 0912   piperacillin-tazobactam (ZOSYN) IVPB 3.375 g  3.375 g Intravenous Q8H Bertis Ruddy, RPH 12.5 mL/hr at 07/07/22 1356 3.375 g at 07/07/22 1356   Allergies as of 07/05/2022   (No Known Allergies)   Review of Systems:  Review of Systems  Respiratory:  Positive for cough. Negative for shortness of breath.   Cardiovascular:  Negative for chest pain and palpitations.  Gastrointestinal:  Positive for abdominal pain. Negative for blood in stool, constipation, diarrhea, melena, nausea and vomiting.    OBJECTIVE:   Temp:  [98.3 F (36.8 C)-98.9 F (37.2 C)] 98.3 F (36.8 C) (09/30 1543) Pulse Rate:  [74-98] 74 (09/30 1543) Resp:  [16-18] 16 (09/30 1543) BP: (110-144)/(80-96) 130/90 (09/30 1543) SpO2:  [96 %-99 %] 96 % (09/30 1543)   Physical Exam Constitutional:      General: He is not in acute distress.    Appearance: He is not ill-appearing, toxic-appearing or diaphoretic.  Cardiovascular:     Rate and Rhythm: Normal rate and regular rhythm.  Pulmonary:     Effort: Pulmonary effort is normal. No respiratory distress.     Breath sounds: Normal breath sounds.  Abdominal:  General: Bowel sounds are normal. There is no distension.     Palpations: Abdomen is soft.     Tenderness: There is abdominal tenderness (peri-umbilical). There is no guarding.  Musculoskeletal:     Right lower leg: No edema.     Left lower leg: No edema.  Skin:    General: Skin is warm and dry.  Neurological:     Mental Status: He is alert.     Labs: Recent Labs    07/05/22 0952 07/06/22 0400 07/07/22 0634  WBC 17.8* 14.4* 8.1  HGB 13.9 11.6* 11.7*  HCT 42.3 35.2* 34.4*  PLT 234 174 176   BMET Recent Labs    07/05/22 0952 07/06/22 0400 07/07/22 0634  NA 142 137 139  K 3.7 3.2* 3.2*  CL 112* 103 108  CO2 18* 23 22  GLUCOSE 140* 104* 79  BUN '13 8 7  '$ CREATININE 1.15 1.25* 1.10  CALCIUM 8.9 8.4* 8.3*   LFT Recent Labs     07/07/22 0634  PROT 5.9*  ALBUMIN 2.7*  AST 14*  ALT 17  ALKPHOS 43  BILITOT 1.0  BILIDIR 0.2  IBILI 0.8   PT/INR No results for input(s): "LABPROT", "INR" in the last 72 hours. Diagnostic imaging: No results found.  IMPRESSION: Cecum, ascending colon and proximal transverse colon inflammation following colonoscopy 07/04/22  - No signs of perforation on CT imaging Abdominal pain, peri-umbilical Leukocytosis, now resolved  PLAN: - Appreciate General Surgery recommendations, pain improving with IV abx therapy and IV hydration - Would advance to GI soft diet tonight - Monitor bowel movements overnight - If pain remains improved tomorrow could consider discharge from GI standpoint  - May require outpatient course of oral antibiotic - Recommend outpatient follow up in office with Dr. Therisa Doyne   LOS: 2 days   Danton Clap, DO [facility]

## 2022-07-08 DIAGNOSIS — K529 Noninfective gastroenteritis and colitis, unspecified: Secondary | ICD-10-CM | POA: Diagnosis not present

## 2022-07-08 LAB — BASIC METABOLIC PANEL
Anion gap: 8 (ref 5–15)
BUN: 7 mg/dL (ref 6–20)
CO2: 24 mmol/L (ref 22–32)
Calcium: 8.7 mg/dL — ABNORMAL LOW (ref 8.9–10.3)
Chloride: 108 mmol/L (ref 98–111)
Creatinine, Ser: 1.1 mg/dL (ref 0.61–1.24)
GFR, Estimated: >60 mL/min (ref >60)
Glucose, Bld: 97 mg/dL (ref 70–99)
Potassium: 3.5 mmol/L (ref 3.5–5.1)
Sodium: 140 mmol/L (ref 135–145)

## 2022-07-08 LAB — CBC WITH DIFFERENTIAL/PLATELET
Abs Immature Granulocytes: 0.03 10*3/uL (ref 0.00–0.07)
Basophils Absolute: 0 10*3/uL (ref 0.0–0.1)
Basophils Relative: 1 %
Eosinophils Absolute: 0.4 10*3/uL (ref 0.0–0.5)
Eosinophils Relative: 6 %
HCT: 36.8 % — ABNORMAL LOW (ref 39.0–52.0)
Hemoglobin: 12.7 g/dL — ABNORMAL LOW (ref 13.0–17.0)
Immature Granulocytes: 0 %
Lymphocytes Relative: 22 %
Lymphs Abs: 1.6 10*3/uL (ref 0.7–4.0)
MCH: 32.2 pg (ref 26.0–34.0)
MCHC: 34.5 g/dL (ref 30.0–36.0)
MCV: 93.2 fL (ref 80.0–100.0)
Monocytes Absolute: 0.5 10*3/uL (ref 0.1–1.0)
Monocytes Relative: 7 %
Neutro Abs: 4.7 10*3/uL (ref 1.7–7.7)
Neutrophils Relative %: 64 %
Platelets: 211 10*3/uL (ref 150–400)
RBC: 3.95 MIL/uL — ABNORMAL LOW (ref 4.22–5.81)
RDW: 12 % (ref 11.5–15.5)
WBC: 7.3 10*3/uL (ref 4.0–10.5)
nRBC: 0 % (ref 0.0–0.2)

## 2022-07-08 LAB — MAGNESIUM: Magnesium: 2.1 mg/dL (ref 1.7–2.4)

## 2022-07-08 MED ORDER — AMOXICILLIN-POT CLAVULANATE 875-125 MG PO TABS: 1.0000 | ORAL_TABLET | Freq: Two times a day (BID) | ORAL | 0 refills | Status: AC

## 2022-07-08 NOTE — Discharge Summary (Signed)
Discharge Summary  Dale [name] GYF:[id number] DOB: 06-Aug-1966  PCP: Dale Roles, PA  Admit date: 07/05/2022 Discharge date: 07/08/2022 Time spent:  30mns  Recommendations for Outpatient Follow-up:  F/u with PCP within a week  for hospital discharge follow up, repeat cbc/bmp at follow up F/u with [facility] Dr KTherisa [name]   Discharge Diagnoses:  Active Hospital Problems   Diagnosis Date Noted   Colitis, acute 07/05/2022   S/P colonoscopy 07/05/2022   Hypercholesteremia 07/05/2022   Hypertension 07/05/2022    Resolved Hospital Problems  No resolved problems to display.    Discharge Condition: stable  Diet recommendation: heart healthy   Filed Weights   07/05/22 1044 07/06/22 0852  Weight: 83.9 kg 81.6 kg    History of present illness: ( per admitting MD Dr Dale [name] Chief Complaint: Abdominal pain   HPI: Dale SAVOis a 56y.o. male with medical history significant of HTN and HLD presenting with post-colonoscopy abdominal pain.  He had his routine colonoscopy yesterday morning.  He did the prep without difficulty.  He was on 3-year surveillance due to polyps, looked better this time, maybe able to wait 5 years next time.  He ate lunch and about 830pm he noticed mild intermittent periumbilical abdominal pain.  It felt "like a very intense ache, it would surge" up to 9, lasting about a minute.  It got more frequent through the night and became TTP.  He had to change position to try to get comfortable but it mostly kept him up through the night.  He had a couple of grainy, dark brown "silty" stools.  He called GI and they told him to come in.  Temp 99.9 at home, never >100.  Occasional fleeting nausea without emesis.        ER Course:  Colonoscopy yesterday.  Severe pain overnight.  CT with diffuse colitis without perf.  WBC 17.8.  Given dilaudid, Zofran, fluids.  GI will consult tomorrow, needs antibiotics.  Hospital Course:  Principal Problem:   Colitis,  acute Active Problems:   S/P colonoscopy   Hypercholesteremia   Hypertension   Colitis post colonoscopy Unclear etiology Seen by gen surg and GI, improved with zosynx4days, ivf, bowel rest, wbc normalized, able to tolerate diet advancement, having BM, no blood in stool, cleared to discharge by GI, he is to discharge on augmentin for another three days to finish total of 7 days abx treatment.   Hypokalemia, replaced and improved mag 1.8    CKDII Cr appear close to baseline ( 1.11- 1.25) Renal dosing meds   HTN/HLD Stable, continue home meds  OSA on cpap   Discharge Exam: BP (!) 136/100 (BP Location: Left Arm)   Pulse 61   Temp 97.9 F (36.6 C) (Oral)   Resp 18   Ht '5\' 8"'$  (1.727 m)   Wt 81.6 kg   SpO2 98%   BMI 27.35 kg/m   General: NAD Cardiovascular: RRR Respiratory: normal respiratory effort     Discharge Instructions     Diet - low sodium heart healthy   Complete by: As directed    Low fiber/low residue diet for next few weeks, low dairy   Increase activity slowly   Complete by: As directed       Allergies as of 07/08/2022   No Known Allergies      Medication List     STOP taking these medications    polyethylene glycol-electrolytes 420 g solution Commonly known as: NuLYTELY   triamcinolone  acetonide 40 MG/ML injection Commonly known as: KENALOG-40   VOLTAREN PO       TAKE these medications    acetaminophen 325 MG tablet Commonly known as: TYLENOL Take 650 mg by mouth every 6 (six) hours as needed for headache, mild pain or moderate pain.   amLODipine 5 MG tablet Commonly known as: NORVASC Take 5 mg by mouth daily.   amoxicillin-clavulanate 875-125 MG tablet Commonly known as: AUGMENTIN Take 1 tablet by mouth 2 (two) times daily for 3 days.   atorvastatin 80 MG tablet Commonly known as: LIPITOR Take 80 mg by mouth daily.   DOCUSATE SODIUM PO Take 1 tablet by mouth as needed (for constipation).   gabapentin 300 MG  capsule Commonly known as: NEURONTIN Take 300 mg by mouth as needed (for flares).   losartan 50 MG tablet Commonly known as: COZAAR Take 50 mg by mouth daily.   pantoprazole 40 MG tablet Commonly known as: PROTONIX Take 40 mg by mouth daily.   sertraline 100 MG tablet Commonly known as: ZOLOFT Take 100 mg by mouth daily.       No Known Allergies  Follow-up Information     Dale Roles, PA Follow up in 2 week(s).   Specialty: Internal Medicine Why: hospital discharge follow up, repeat basic lab works including cbc/bmp Contact information: [address] [place] [address] [phone]         Dale Juniper, MD Follow up in 2 week(s).   Specialty: Gastroenterology Why: hospital discharge follow up Contact information: [address] [place] [address] [phone]                  The results of significant diagnostics from this hospitalization (including imaging, microbiology, ancillary and laboratory) are listed below for reference.    Significant Diagnostic Studies: CT ABDOMEN PELVIS W CONTRAST  Result Date: 07/05/2022 CLINICAL DATA:  Abdominal pain. EXAM: CT ABDOMEN AND PELVIS WITH CONTRAST TECHNIQUE: Multidetector CT imaging of the abdomen and pelvis was performed using the standard protocol following bolus administration of intravenous contrast. RADIATION DOSE REDUCTION: This exam was performed according to the departmental dose-optimization program which includes automated exposure control, adjustment of the mA and/or kV according to patient size and/or use of iterative reconstruction technique. CONTRAST:  133m OMNIPAQUE IOHEXOL 350 MG/ML SOLN COMPARISON:  CT examination dated November 21, 2014 FINDINGS: Lower chest: Bibasilar dependent atelectasis. Hepatobiliary: No focal liver abnormality is seen. No gallstones, gallbladder wall thickening, or biliary dilatation. Pancreas: Unremarkable. No pancreatic ductal dilatation or  surrounding inflammatory changes. Spleen: Normal in size without focal abnormality. Adrenals/Urinary Tract: Adrenal glands are unremarkable. Kidneys are normal, without renal calculi, focal lesion, or hydronephrosis. Bladder is unremarkable. Stomach/Bowel: Stomach is within normal limits. Appendix appears normal. There is marked thickening of the cecum, ascending colon and proximal half of the transverse colon suggesting severe colitis with mild adjacent inflammatory changes. No evidence of pneumatosis. Distal transverse colon, descending and sigmoid colon are unremarkable. Vascular/Lymphatic: Mild aortic atherosclerosis. No enlarged abdominal or pelvic lymph nodes. Reproductive: Prostate is unremarkable. Other: No abdominal wall hernia or abnormality. No abdominopelvic ascites. Musculoskeletal: No acute or significant osseous findings. IMPRESSION: 1. Marked thickening of the cecum, ascending colon and proximal half of the transverse colon suggesting severe colitis with mild adjacent inflammatory changes. No evidence of pneumatosis or extraluminal free air. 2. Appendix is normal. 3. No evidence of nephrolithiasis or hydronephrosis. Electronically Signed   By: IJudye BosO.  On: 07/05/2022 12:23    Microbiology: No results found for this or any previous visit (from the past 240 hour(s)).   Labs: Basic Metabolic Panel: Recent Labs  Lab 07/05/22 0952 07/06/22 0400 07/07/22 0634 07/08/22 0535  NA 142 137 139 140  K 3.7 3.2* 3.2* 3.5  CL 112* 103 108 108  CO2 18* '23 22 24  '$ GLUCOSE 140* 104* 79 97  BUN '13 8 7 7  '$ CREATININE 1.15 1.25* 1.10 1.10  CALCIUM 8.9 8.4* 8.3* 8.7*  MG  --   --  1.8 2.1   Liver Function Tests: Recent Labs  Lab 07/05/22 0952 07/06/22 0400 07/07/22 0634  AST 18 15 14*  ALT '23 17 17  '$ ALKPHOS 65 49 43  BILITOT 1.2 1.6* 1.0  PROT 6.6 5.6* 5.9*  ALBUMIN 3.7 2.9* 2.7*   Recent Labs  Lab 07/05/22 0952  LIPASE 24   No results for input(s): "AMMONIA" in the last  168 hours. CBC: Recent Labs  Lab 07/05/22 0952 07/06/22 0400 07/07/22 0634 07/08/22 0535  WBC 17.8* 14.4* 8.1 7.3  NEUTROABS 16.3*  --  6.2 4.7  HGB 13.9 11.6* 11.7* 12.7*  HCT 42.3 35.2* 34.4* 36.8*  MCV 96.1 97.5 93.7 93.2  PLT 234 174 176 211   Cardiac Enzymes: No results for input(s): "CKTOTAL", "CKMB", "CKMBINDEX", "TROPONINI" in the last 168 hours. BNP: BNP (last 3 results) No results for input(s): "BNP" in the last 8760 hours.  ProBNP (last 3 results) No results for input(s): "PROBNP" in the last 8760 hours.  CBG: No results for input(s): "GLUCAP" in the last 168 hours.  FURTHER DISCHARGE INSTRUCTIONS:   Get Medicines reviewed and adjusted: Please take all your medications with you for your next visit with your Primary MD   Laboratory/radiological data: Please request your Primary MD to go over all hospital tests and procedure/radiological results at the follow up, please ask your Primary MD to get all Hospital records sent to his/her office.   In some cases, they will be blood work, cultures and biopsy results pending at the time of your discharge. Please request that your primary care M.D. goes through all the records of your hospital data and follows up on these results.   Also Note the following: If you experience worsening of your admission symptoms, develop shortness of breath, life threatening emergency, suicidal or homicidal thoughts you must seek medical attention immediately by calling 911 or calling your MD immediately  if symptoms less severe.   You must read complete instructions/literature along with all the possible adverse reactions/side effects for all the Medicines you take and that have been prescribed to you. Take any new Medicines after you have completely understood and accpet all the possible adverse reactions/side effects.    Do not drive when taking Pain medications or sleeping medications (Benzodaizepines)   Do not take more than prescribed  Pain, Sleep and Anxiety Medications. It is not advisable to combine anxiety,sleep and pain medications without talking with your primary care practitioner   Special Instructions: If you have smoked or chewed Tobacco  in the last 2 yrs please stop smoking, stop any regular Alcohol  and or any Recreational drug use.   Wear Seat belts while driving.   Please note: You were cared for by a hospitalist during your hospital stay. Once you are discharged, your primary care physician will handle any further medical issues. Please note that NO REFILLS for any discharge medications will be authorized once you are discharged, as it  is imperative that you return to your primary care physician (or establish a relationship with a primary care physician if you do not have one) for your post hospital discharge needs so that they can reassess your need for medications and monitor your lab values.     Signed:  Florencia Reasons MD, PhD, FACP  [facility] 07/08/2022, 10:12 AM

## 2022-07-08 NOTE — Plan of Care (Signed)

## 2022-07-08 NOTE — Progress Notes (Signed)
Pharmacy Antibiotic Note  Dale [name] is a 56 y.o. male admitted on 07/05/2022 with  colitis of the right colon following a colonoscopy .  Pharmacy has been consulted for Zosyn dosing.  Patient's white blood cell count is not elevated and he is afebrile, tolerating well.  Surgery recommends the continuation of IV antibiotics.   Plan: Continue Zosyn 3.375g IV extended infusion q8 hours - dose appropriate per renal function    Height: '5\' 8"'$  (172.7 cm) Weight: 81.6 kg (179 lb 14.3 oz) IBW/kg (Calculated) : 68.4  Temp (24hrs), Avg:98.1 F (36.7 C), Min:97.9 F (36.6 C), Max:98.3 F (36.8 C)  Recent Labs  Lab 07/05/22 0952 07/06/22 0400 07/07/22 0634 07/08/22 0535  WBC 17.8* 14.4* 8.1 7.3  CREATININE 1.15 1.25* 1.10 1.10  LATICACIDVEN  --  1.5  --   --     Estimated Creatinine Clearance: 75.1 mL/min (by C-G formula based on SCr of 1.1 mg/dL).    No Known Allergies  Dose adjustments this admission: N/A  Microbiology results: No culture data  Thank you for allowing pharmacy to be a part of this patient's care.  Vicenta Dunning, PharmD  PGY1 Pharmacy Resident

## 2022-07-08 NOTE — Progress Notes (Signed)
[facility] Progress Note  SUBJECTIVE:   Interval history: Dale [name] was seen and evaluated today at bedside. He tolerated solid food overnight well. He had a dark brown bowel movement this morning. No nausea or vomiting. Abdominal pain is largely resolved, he will have some discomfort if he moves a certain way.   Past Medical History:  Diagnosis Date   Hypercholesteremia    Hypertension    Mononucleosis    Past Surgical History:  Procedure Laterality Date   HERNIA REPAIR     SHOULDER ARTHROSCOPY     Current Facility-Administered Medications  Medication Dose Route Frequency Provider Last Rate Last Admin   acetaminophen (TYLENOL) tablet 650 mg  650 mg Oral Q6H PRN Karmen Bongo, MD   650 mg at 07/05/22 2238   Or   acetaminophen (TYLENOL) suppository 650 mg  650 mg Rectal Q6H PRN Karmen Bongo, MD       amLODipine (NORVASC) tablet 5 mg  5 mg Oral Daily Karmen Bongo, MD   5 mg at 07/07/22 0912   atorvastatin (LIPITOR) tablet 80 mg  80 mg Oral Daily Karmen Bongo, MD   80 mg at 07/07/22 4098   hydrALAZINE (APRESOLINE) injection 10 mg  10 mg Intravenous Q2H PRN Karmen Bongo, MD       lactated ringers infusion   Intravenous Continuous Florencia Reasons, MD 75 mL/hr at 07/07/22 2235 New Bag at 07/07/22 2235   losartan (COZAAR) tablet 50 mg  50 mg Oral Daily Karmen Bongo, MD   50 mg at 07/07/22 0912   morphine (PF) 2 MG/ML injection 2 mg  2 mg Intravenous Q2H PRN Karmen Bongo, MD   2 mg at 07/05/22 1952   ondansetron (ZOFRAN-ODT) disintegrating tablet 4 mg  4 mg Oral Q6H PRN Karmen Bongo, MD       Or   ondansetron [facility]) injection 4 mg  4 mg Intravenous Q6H PRN Karmen Bongo, MD   4 mg at 07/05/22 1951   oxyCODONE (Oxy IR/ROXICODONE) immediate release tablet 5-10 mg  5-10 mg Oral Q4H PRN Karmen Bongo, MD   10 mg at 07/06/22 2115   pantoprazole (PROTONIX) EC tablet 40 mg  40 mg Oral Daily Karmen Bongo, MD   40 mg at 07/07/22 0912    piperacillin-tazobactam (ZOSYN) IVPB 3.375 g  3.375 g Intravenous Q8H Bertis Ruddy, RPH 12.5 mL/hr at 07/08/22 0509 3.375 g at 07/08/22 0509   Allergies as of 07/05/2022   (No Known Allergies)   Review of Systems:  Review of Systems  Respiratory:  Negative for shortness of breath.   Cardiovascular:  Negative for chest pain.  Gastrointestinal:  Negative for abdominal pain, blood in stool, constipation, diarrhea, nausea and vomiting.    OBJECTIVE:   Temp:  [97.9 F (36.6 C)-98.3 F (36.8 C)] 97.9 F (36.6 C) (10/01 0700) Pulse Rate:  [61-92] 61 (10/01 0700) Resp:  [16-20] 18 (10/01 0700) BP: (115-144)/(79-100) 136/100 (10/01 0700) SpO2:  [96 %-99 %] 98 % (10/01 0700)   Physical Exam Constitutional:      General: He is not in acute distress.    Appearance: He is not ill-appearing, toxic-appearing or diaphoretic.  Cardiovascular:     Rate and Rhythm: Normal rate and regular rhythm.  Pulmonary:     Effort: No respiratory distress.     Breath sounds: Normal breath sounds.  Abdominal:     General: Bowel sounds are normal. There is no distension.     Palpations: Abdomen is soft.     Tenderness:  There is no abdominal tenderness.  Neurological:     Mental Status: He is alert.     Labs: Recent Labs    07/06/22 0400 07/07/22 0634 07/08/22 0535  WBC 14.4* 8.1 7.3  HGB 11.6* 11.7* 12.7*  HCT 35.2* 34.4* 36.8*  PLT 174 176 211   BMET Recent Labs    07/06/22 0400 07/07/22 0634 07/08/22 0535  NA 137 139 140  K 3.2* 3.2* 3.5  CL 103 108 108  CO2 '23 22 24  '$ GLUCOSE 104* 79 97  BUN '8 7 7  '$ CREATININE 1.25* 1.10 1.10  CALCIUM 8.4* 8.3* 8.7*   LFT Recent Labs    07/07/22 0634  PROT 5.9*  ALBUMIN 2.7*  AST 14*  ALT 17  ALKPHOS 43  BILITOT 1.0  BILIDIR 0.2  IBILI 0.8   PT/INR No results for input(s): "LABPROT", "INR" in the last 72 hours. Diagnostic imaging: No results found.  IMPRESSION: Cecum, ascending colon and proximal transverse colon inflammation  following colonoscopy 07/04/22             - No signs of perforation on CT imaging Abdominal pain, peri-umbilical Leukocytosis, now resolved  PLAN: - Abdominal pain improved, he is tolerating low residue diet well and having bowel movements  - Appears stable for discharge from GI standpoint today  - Recommend low fiber/low residue diet for next few weeks, low dairy as well  - Recommend outpatient follow up with Dr. Therisa Doyne within the next 2 weeks   LOS: 3 days   Danton Clap, [facility]

## 2022-07-08 NOTE — Progress Notes (Signed)
       Subjective: Denies pain. Tolerating clear liquids. Having bowel movements, nonbloody. WBC normal.   Objective: Vital signs in last 24 hours: Temp:  [97.9 F (36.6 C)-98.3 F (36.8 C)] 97.9 F (36.6 C) (10/01 0700) Pulse Rate:  [61-83] 61 (10/01 0700) Resp:  [16-20] 18 (10/01 0700) BP: (115-136)/(79-100) 136/100 (10/01 0700) SpO2:  [96 %-98 %] 98 % (10/01 0700)    Intake/Output from previous day: No intake/output data recorded. Intake/Output this shift: No intake/output data recorded.  PE: General: resting comfortably, NAD Neuro: alert and oriented, no focal deficits Resp: normal work of breathing on room air Abdomen: soft, nondistended, nontender. Extremities: warm and well-perfused   Lab Results:  Recent Labs    07/07/22 0634 07/08/22 0535  WBC 8.1 7.3  HGB 11.7* 12.7*  HCT 34.4* 36.8*  PLT 176 211   BMET Recent Labs    07/07/22 0634 07/08/22 0535  NA 139 140  K 3.2* 3.5  CL 108 108  CO2 22 24  GLUCOSE 79 97  BUN 7 7  CREATININE 1.10 1.10  CALCIUM 8.3* 8.7*   PT/INR No results for input(s): "LABPROT", "INR" in the last 72 hours. CMP     Component Value Date/Time   NA 140 07/08/2022 0535   K 3.5 07/08/2022 0535   CL 108 07/08/2022 0535   CO2 24 07/08/2022 0535   GLUCOSE 97 07/08/2022 0535   BUN 7 07/08/2022 0535   CREATININE 1.10 07/08/2022 0535   CALCIUM 8.7 (L) 07/08/2022 0535   PROT 5.9 (L) 07/07/2022 0634   ALBUMIN 2.7 (L) 07/07/2022 0634   AST 14 (L) 07/07/2022 0634   ALT 17 07/07/2022 0634   ALKPHOS 43 07/07/2022 0634   BILITOT 1.0 07/07/2022 0634   GFRNONAA >60 07/08/2022 0535   GFRAA >60 11/14/2019 1323   Lipase     Component Value Date/Time   LIPASE 24 07/05/2022 0952       Studies/Results: No results found.  Anti-infectives: Anti-infectives (From admission, onward)    Start     Dose/Rate Route Frequency Ordered Stop   07/08/22 0000  amoxicillin-clavulanate (AUGMENTIN) 875-125 MG tablet        1 tablet Oral  2 times daily 07/08/22 1003 07/11/22 2359   07/05/22 2200  piperacillin-tazobactam (ZOSYN) IVPB 3.375 g        3.375 g 12.5 mL/hr over 240 Minutes Intravenous Every 8 hours 07/05/22 1515     07/05/22 1345  piperacillin-tazobactam (ZOSYN) IVPB 3.375 g        3.375 g 12.5 mL/hr over 240 Minutes Intravenous  Once 07/05/22 1340 07/05/22 1950        Assessment/Plan 56 yo male presenting with colitis of the right colon following a colonoscopy.  - Symptoms resolved - Advance to soft diet - Surgery will sign off. Please call with any further questions or concerns.   LOS: 3 days    Michaelle Birks, MD [facility] General, Hepatobiliary and Pancreatic Surgery 07/08/22 1:38 PM

## 2022-07-08 NOTE — Plan of Care (Signed)

## 2022-07-08 NOTE — Plan of Care (Signed)
  Problem: Education: Goal: Knowledge of General Education information will improve Description: Including pain rating scale, medication(s)/side effects and non-pharmacologic comfort measures 07/08/2022 0442 by Sharion Settler, RN Outcome: Progressing 07/08/2022 0441 by Sharion Settler, RN Outcome: Progressing   Problem: Health Behavior/Discharge Planning: Goal: Ability to manage health-related needs will improve 07/08/2022 0442 by Sharion Settler, RN Outcome: Progressing 07/08/2022 0441 by Sharion Settler, RN Outcome: Progressing   Problem: Clinical Measurements: Goal: Ability to maintain clinical measurements within normal limits will improve 07/08/2022 0442 by Sharion Settler, RN Outcome: Progressing 07/08/2022 0441 by Sharion Settler, RN Outcome: Progressing Goal: Will remain free from infection 07/08/2022 0442 by Sharion Settler, RN Outcome: Progressing 07/08/2022 0441 by Sharion Settler, RN Outcome: Progressing Goal: Diagnostic test results will improve 07/08/2022 0442 by Sharion Settler, RN Outcome: Progressing 07/08/2022 0441 by Sharion Settler, RN Outcome: Progressing Goal: Respiratory complications will improve 07/08/2022 0442 by Sharion Settler, RN Outcome: Progressing 07/08/2022 0441 by Sharion Settler, RN Outcome: Progressing Goal: Cardiovascular complication will be avoided 07/08/2022 0442 by Sharion Settler, RN Outcome: Progressing 07/08/2022 0441 by Sharion Settler, RN Outcome: Progressing   Problem: Activity: Goal: Risk for activity intolerance will decrease 07/08/2022 0442 by Sharion Settler, RN Outcome: Progressing 07/08/2022 0441 by Sharion Settler, RN Outcome: Progressing   Problem: Nutrition: Goal: Adequate nutrition will be maintained 07/08/2022 0442 by Sharion Settler, RN Outcome: Progressing 07/08/2022 0441 by Sharion Settler, RN Outcome: Progressing   Problem: Coping: Goal: Level  of anxiety will decrease 07/08/2022 0442 by Sharion Settler, RN Outcome: Progressing 07/08/2022 0441 by Sharion Settler, RN Outcome: Progressing   Problem: Elimination: Goal: Will not experience complications related to bowel motility 07/08/2022 0442 by Sharion Settler, RN Outcome: Progressing 07/08/2022 0441 by Sharion Settler, RN Outcome: Progressing Goal: Will not experience complications related to urinary retention 07/08/2022 0442 by Sharion Settler, RN Outcome: Progressing 07/08/2022 0441 by Sharion Settler, RN Outcome: Progressing   Problem: Pain Managment: Goal: General experience of comfort will improve 07/08/2022 0442 by Sharion Settler, RN Outcome: Progressing 07/08/2022 0441 by Sharion Settler, RN Outcome: Progressing   Problem: Safety: Goal: Ability to remain free from injury will improve 07/08/2022 0442 by Sharion Settler, RN Outcome: Progressing 07/08/2022 0441 by Sharion Settler, RN Outcome: Progressing   Problem: Skin Integrity: Goal: Risk for impaired skin integrity will decrease 07/08/2022 0442 by Sharion Settler, RN Outcome: Progressing 07/08/2022 0441 by Sharion Settler, RN Outcome: Progressing

## 2022-07-19 DIAGNOSIS — R197 Diarrhea, unspecified: Secondary | ICD-10-CM | POA: Diagnosis not present

## 2022-07-30 DIAGNOSIS — G4733 Obstructive sleep apnea (adult) (pediatric): Secondary | ICD-10-CM | POA: Diagnosis not present

## 2022-08-30 DIAGNOSIS — G4733 Obstructive sleep apnea (adult) (pediatric): Secondary | ICD-10-CM | POA: Diagnosis not present

## 2022-09-29 DIAGNOSIS — G4733 Obstructive sleep apnea (adult) (pediatric): Secondary | ICD-10-CM | POA: Diagnosis not present

## 2022-11-26 DIAGNOSIS — H6692 Otitis media, unspecified, left ear: Secondary | ICD-10-CM | POA: Diagnosis not present

## 2022-11-26 DIAGNOSIS — H6122 Impacted cerumen, left ear: Secondary | ICD-10-CM | POA: Diagnosis not present

## 2023-01-14 DIAGNOSIS — I10 Essential (primary) hypertension: Secondary | ICD-10-CM | POA: Diagnosis not present

## 2023-01-14 DIAGNOSIS — G4733 Obstructive sleep apnea (adult) (pediatric): Secondary | ICD-10-CM | POA: Diagnosis not present

## 2023-02-11 DIAGNOSIS — G4733 Obstructive sleep apnea (adult) (pediatric): Secondary | ICD-10-CM | POA: Diagnosis not present

## 2024-01-14 DIAGNOSIS — G4733 Obstructive sleep apnea (adult) (pediatric): Secondary | ICD-10-CM | POA: Diagnosis not present

## 2024-01-22 DIAGNOSIS — G4733 Obstructive sleep apnea (adult) (pediatric): Secondary | ICD-10-CM | POA: Diagnosis not present

## 2024-02-13 ENCOUNTER — Other Ambulatory Visit: Payer: Self-pay | Admitting: Medical Genetics

## 2024-02-13 DIAGNOSIS — G4733 Obstructive sleep apnea (adult) (pediatric): Secondary | ICD-10-CM | POA: Diagnosis not present

## 2024-02-18 ENCOUNTER — Other Ambulatory Visit (HOSPITAL_COMMUNITY)
Admission: RE | Admit: 2024-02-18 | Discharge: 2024-02-18 | Disposition: A | Discharge status: Home / Self Care | Payer: Self-pay | Source: Ambulatory Visit | Attending: Medical Genetics | Admitting: Medical Genetics

## 2024-02-28 LAB — GENECONNECT MOLECULAR SCREEN: Genetic Analysis Overall Interpretation: NEGATIVE

## 2024-03-15 DIAGNOSIS — G4733 Obstructive sleep apnea (adult) (pediatric): Secondary | ICD-10-CM | POA: Diagnosis not present

## 2024-03-17 DIAGNOSIS — M5412 Radiculopathy, cervical region: Secondary | ICD-10-CM | POA: Diagnosis not present

## 2024-03-26 DIAGNOSIS — M5412 Radiculopathy, cervical region: Secondary | ICD-10-CM | POA: Diagnosis not present

## 2024-07-20 DIAGNOSIS — Z0000 Encounter for general adult medical examination without abnormal findings: Secondary | ICD-10-CM | POA: Diagnosis not present

## 2024-07-20 DIAGNOSIS — G2581 Restless legs syndrome: Secondary | ICD-10-CM | POA: Diagnosis not present

## 2024-07-20 DIAGNOSIS — E7800 Pure hypercholesterolemia, unspecified: Secondary | ICD-10-CM | POA: Diagnosis not present

## 2024-07-20 DIAGNOSIS — I10 Essential (primary) hypertension: Secondary | ICD-10-CM | POA: Diagnosis not present

## 2024-07-20 DIAGNOSIS — G4733 Obstructive sleep apnea (adult) (pediatric): Secondary | ICD-10-CM | POA: Diagnosis not present
